# Patient Record
Sex: Female | Born: 1995 | Race: Black or African American | Hispanic: No | Marital: Single | Smoking: Never smoker
Health system: Southern US, Community
[De-identification: ages and names within clinical notes are randomized; demographics above are authoritative.]

## PROBLEM LIST (undated history)

## (undated) DIAGNOSIS — U071 COVID-19: Secondary | ICD-10-CM

## (undated) DIAGNOSIS — E119 Type 2 diabetes mellitus without complications: Secondary | ICD-10-CM

## (undated) HISTORY — DX: Type 2 diabetes mellitus without complications: E11.9

---

## 2019-03-23 ENCOUNTER — Other Ambulatory Visit: Payer: Self-pay

## 2019-03-23 DIAGNOSIS — Z20822 Contact with and (suspected) exposure to covid-19: Secondary | ICD-10-CM

## 2019-03-24 LAB — NOVEL CORONAVIRUS, NAA: SARS-CoV-2, NAA: NOT DETECTED

## 2020-02-01 ENCOUNTER — Encounter (HOSPITAL_COMMUNITY): Payer: Self-pay

## 2020-02-01 ENCOUNTER — Inpatient Hospital Stay (HOSPITAL_COMMUNITY)
Admission: EM | Admit: 2020-02-01 | Discharge: 2020-02-05 | DRG: 176 | Disposition: A | Discharge status: Home / Self Care | Payer: 59 | Attending: Internal Medicine | Admitting: Internal Medicine

## 2020-02-01 ENCOUNTER — Emergency Department (HOSPITAL_COMMUNITY): Payer: 59

## 2020-02-01 ENCOUNTER — Other Ambulatory Visit: Payer: Self-pay

## 2020-02-01 DIAGNOSIS — Z6841 Body Mass Index (BMI) 40.0 and over, adult: Secondary | ICD-10-CM

## 2020-02-01 DIAGNOSIS — Z8249 Family history of ischemic heart disease and other diseases of the circulatory system: Secondary | ICD-10-CM

## 2020-02-01 DIAGNOSIS — Z833 Family history of diabetes mellitus: Secondary | ICD-10-CM | POA: Diagnosis not present

## 2020-02-01 DIAGNOSIS — I2699 Other pulmonary embolism without acute cor pulmonale: Secondary | ICD-10-CM | POA: Diagnosis not present

## 2020-02-01 DIAGNOSIS — Z8616 Personal history of COVID-19: Secondary | ICD-10-CM

## 2020-02-01 DIAGNOSIS — U071 COVID-19: Secondary | ICD-10-CM | POA: Diagnosis present

## 2020-02-01 DIAGNOSIS — R042 Hemoptysis: Secondary | ICD-10-CM | POA: Diagnosis not present

## 2020-02-01 DIAGNOSIS — B948 Sequelae of other specified infectious and parasitic diseases: Secondary | ICD-10-CM

## 2020-02-01 DIAGNOSIS — R0602 Shortness of breath: Secondary | ICD-10-CM | POA: Diagnosis present

## 2020-02-01 DIAGNOSIS — N200 Calculus of kidney: Secondary | ICD-10-CM | POA: Diagnosis present

## 2020-02-01 HISTORY — DX: COVID-19: U07.1

## 2020-02-01 LAB — CBC WITH DIFFERENTIAL/PLATELET
Abs Immature Granulocytes: 0.03 10*3/uL (ref 0.00–0.07)
Basophils Absolute: 0 10*3/uL (ref 0.0–0.1)
Basophils Relative: 0 %
Eosinophils Absolute: 0.1 10*3/uL (ref 0.0–0.5)
Eosinophils Relative: 1 %
HCT: 38.9 % (ref 36.0–46.0)
Hemoglobin: 12.7 g/dL (ref 12.0–15.0)
Immature Granulocytes: 0 %
Lymphocytes Relative: 38 %
Lymphs Abs: 3.1 10*3/uL (ref 0.7–4.0)
MCH: 28.3 pg (ref 26.0–34.0)
MCHC: 32.6 g/dL (ref 30.0–36.0)
MCV: 86.8 fL (ref 80.0–100.0)
Monocytes Absolute: 1.3 10*3/uL — ABNORMAL HIGH (ref 0.1–1.0)
Monocytes Relative: 16 %
Neutro Abs: 3.7 10*3/uL (ref 1.7–7.7)
Neutrophils Relative %: 45 %
Platelets: 426 10*3/uL — ABNORMAL HIGH (ref 150–400)
RBC: 4.48 MIL/uL (ref 3.87–5.11)
RDW: 15.1 % (ref 11.5–15.5)
WBC: 8.2 10*3/uL (ref 4.0–10.5)
nRBC: 0 % (ref 0.0–0.2)

## 2020-02-01 LAB — URINALYSIS, ROUTINE W REFLEX MICROSCOPIC
Bacteria, UA: NONE SEEN
Bilirubin Urine: NEGATIVE
Glucose, UA: NEGATIVE mg/dL
Hgb urine dipstick: NEGATIVE
Ketones, ur: NEGATIVE mg/dL
Nitrite: NEGATIVE
Protein, ur: 100 mg/dL — AB
Specific Gravity, Urine: 1.026 (ref 1.005–1.030)
pH: 5 (ref 5.0–8.0)

## 2020-02-01 LAB — SARS CORONAVIRUS 2 BY RT PCR (HOSPITAL ORDER, PERFORMED IN ~~LOC~~ HOSPITAL LAB): SARS Coronavirus 2: POSITIVE — AB

## 2020-02-01 LAB — BASIC METABOLIC PANEL
Anion gap: 14 (ref 5–15)
BUN: 11 mg/dL (ref 6–20)
CO2: 21 mmol/L — ABNORMAL LOW (ref 22–32)
Calcium: 9.8 mg/dL (ref 8.9–10.3)
Chloride: 105 mmol/L (ref 98–111)
Creatinine, Ser: 0.93 mg/dL (ref 0.44–1.00)
GFR calc Af Amer: >60 mL/min (ref >60)
GFR calc non Af Amer: >60 mL/min (ref >60)
Glucose, Bld: 114 mg/dL — ABNORMAL HIGH (ref 70–99)
Potassium: 3.8 mmol/L (ref 3.5–5.1)
Sodium: 140 mmol/L (ref 135–145)

## 2020-02-01 LAB — I-STAT BETA HCG BLOOD, ED (MC, WL, AP ONLY): I-stat hCG, quantitative: <5 m[IU]/mL (ref <5)

## 2020-02-01 LAB — PROTIME-INR
INR: 1 (ref 0.8–1.2)
INR: 1 (ref 0.8–1.2)
Prothrombin Time: 12.9 seconds (ref 11.4–15.2)
Prothrombin Time: 12.8 seconds (ref 11.4–15.2)

## 2020-02-01 LAB — APTT: aPTT: 23 seconds — ABNORMAL LOW (ref 24–36)

## 2020-02-01 MED ORDER — HEPARIN (PORCINE) 25000 UT/250ML-% IV SOLN
1300.0000 [IU]/h | INTRAVENOUS | Status: DC
  Administered 2020-02-01: 1300 [IU]/h via INTRAVENOUS
  Filled 2020-02-01: qty 250

## 2020-02-01 MED ORDER — ONDANSETRON HCL 4 MG/2ML IJ SOLN
4.0000 mg | Freq: Four times a day (QID) | INTRAMUSCULAR | Status: DC | PRN
  Administered 2020-02-01 – 2020-02-03 (×2): 4 mg via INTRAVENOUS
  Filled 2020-02-01 (×2): qty 2

## 2020-02-01 MED ORDER — ALBUTEROL SULFATE HFA 108 (90 BASE) MCG/ACT IN AERS
1.0000 | INHALATION_SPRAY | Freq: Four times a day (QID) | RESPIRATORY_TRACT | Status: DC | PRN
  Administered 2020-02-01 – 2020-02-03 (×2): 2 via RESPIRATORY_TRACT
  Filled 2020-02-01: qty 6.7

## 2020-02-01 MED ORDER — SODIUM CHLORIDE 0.9% FLUSH
3.0000 mL | Freq: Two times a day (BID) | INTRAVENOUS | Status: DC
  Administered 2020-02-02 – 2020-02-05 (×7): 3 mL via INTRAVENOUS

## 2020-02-01 MED ORDER — ONDANSETRON HCL 4 MG PO TABS: 4.0000 mg | ORAL_TABLET | Freq: Four times a day (QID) | ORAL | Status: DC | PRN

## 2020-02-01 MED ORDER — GUAIFENESIN-DM 100-10 MG/5ML PO SYRP
10.0000 mL | ORAL_SOLUTION | ORAL | Status: DC | PRN
  Administered 2020-02-03: 10 mL via ORAL
  Filled 2020-02-01: qty 10

## 2020-02-01 MED ORDER — MORPHINE SULFATE (PF) 2 MG/ML IV SOLN
2.0000 mg | INTRAVENOUS | Status: DC | PRN
  Administered 2020-02-01 – 2020-02-02 (×2): 2 mg via INTRAVENOUS
  Filled 2020-02-01 (×2): qty 1

## 2020-02-01 MED ORDER — ACETAMINOPHEN 325 MG PO TABS
650.0000 mg | ORAL_TABLET | Freq: Four times a day (QID) | ORAL | Status: DC | PRN
  Administered 2020-02-03 – 2020-02-05 (×2): 650 mg via ORAL
  Filled 2020-02-01 (×2): qty 2

## 2020-02-01 MED ORDER — HYDROCOD POLST-CPM POLST ER 10-8 MG/5ML PO SUER
5.0000 mL | Freq: Two times a day (BID) | ORAL | Status: DC | PRN
  Administered 2020-02-03: 5 mL via ORAL
  Filled 2020-02-01: qty 5

## 2020-02-01 MED ORDER — ACETAMINOPHEN 325 MG PO TABS
650.0000 mg | ORAL_TABLET | Freq: Once | ORAL | Status: AC
  Administered 2020-02-01: 650 mg via ORAL
  Filled 2020-02-01: qty 2

## 2020-02-01 MED ORDER — SODIUM CHLORIDE (PF) 0.9 % IJ SOLN
INTRAMUSCULAR | Status: AC
  Administered 2020-02-01: 3 mL via INTRAVENOUS
  Filled 2020-02-01: qty 50

## 2020-02-01 MED ORDER — HEPARIN BOLUS VIA INFUSION
4000.0000 [IU] | Freq: Once | INTRAVENOUS | Status: AC
  Administered 2020-02-01: 4000 [IU] via INTRAVENOUS
  Filled 2020-02-01: qty 4000

## 2020-02-01 MED ORDER — IOHEXOL 350 MG/ML SOLN
100.0000 mL | Freq: Once | INTRAVENOUS | Status: AC | PRN
  Administered 2020-02-01: 100 mL via INTRAVENOUS

## 2020-02-01 MED ORDER — ONDANSETRON HCL 4 MG/2ML IJ SOLN
4.0000 mg | Freq: Once | INTRAMUSCULAR | Status: AC
  Administered 2020-02-01: 4 mg via INTRAVENOUS
  Filled 2020-02-01: qty 2

## 2020-02-01 MED ORDER — HYDROMORPHONE HCL 1 MG/ML IJ SOLN
1.0000 mg | Freq: Once | INTRAMUSCULAR | Status: AC
  Administered 2020-02-01: 1 mg via INTRAVENOUS
  Filled 2020-02-01: qty 1

## 2020-02-01 MED ORDER — HYDROCODONE-ACETAMINOPHEN 5-325 MG PO TABS
1.0000 | ORAL_TABLET | Freq: Once | ORAL | Status: AC
  Administered 2020-02-01: 1 via ORAL
  Filled 2020-02-01: qty 1

## 2020-02-01 MED ORDER — HYDROCODONE-ACETAMINOPHEN 5-325 MG PO TABS
1.0000 | ORAL_TABLET | Freq: Four times a day (QID) | ORAL | Status: DC | PRN
  Administered 2020-02-02: 1 via ORAL
  Filled 2020-02-01: qty 1

## 2020-02-01 NOTE — ED Triage Notes (Signed)
Patient c/o right uppr abdominal pain, right upper back pain and SOB.  Patient states she tested Covid + more than 2 weeks ago. Patient states her quarantine was over 3 days ago. Patient reports that she began having the abdominal, upper back pain and SOB yesterday.

## 2020-02-01 NOTE — Progress Notes (Signed)
ANTICOAGULATION CONSULT NOTE - Initial Consult  Pharmacy Consult for heparin Indication: pulmonary embolus  No Known Allergies  Patient Measurements: Height: 5\' 3"  (160 cm) Weight: 104.4 kg (230 lb 3.2 oz) IBW/kg (Calculated) : 52.4 Heparin Dosing Weight: 77.2kg  Vital Signs: Temp: 98.6 F (37 C) (09/04 1918) Temp Source: Oral (09/04 1918) BP: 124/78 (09/04 1918) Pulse Rate: 106 (09/04 1918)  Labs: Recent Labs    02/01/20 1740  HGB 12.7  HCT 38.9  PLT 426*  CREATININE 0.93    Estimated Creatinine Clearance: 110.6 mL/min (by C-G formula based on SCr of 0.93 mg/dL).   Medical History: Past Medical History:  Diagnosis Date  . COVID-19      Assessment: 24 yo female with Patient c/o right uppr abdominal pain, right upper back pain and SOB. Patient states she tested Covid + more than 2 weeks ago.  Pharmacy consulted to dose heparin drip for PE.  No prior AC noted.  02/01/2020  CBC WNL Scr WNL APTT and pt/INR ordered stat   Goal of Therapy:  Heparin level 0.3-0.7 units/ml Monitor platelets by anticoagulation protocol:   Plan:  Heparin bolus 4000 units x 1 then start heparin drip at 1300 units/hr Check heparin level in 6 hours Daily CBC  04/02/2020 RPh 02/01/2020, 7:56 PM

## 2020-02-01 NOTE — ED Provider Notes (Signed)
[facility]-EMERGENCY DEPT Provider Note   CSN: [id number] Arrival date & time: 02/01/20  1407     History Chief Complaint  Patient presents with  . Shortness of Breath  . Back Pain  . Abdominal Pain    Tracey Montgomery is a 24 y.o. female.  HPI    24 y/o comes in with cc of chest pain and back pain. She is having R sided chest pain and back pain, no new cough. Chest pain is worse with cough and she has associated dib. She is 2+ weeks post covid.  Pt has no hx of PE, DVT and denies any exogenous hormone (testosterone / estrogen) use, long distance travels or surgery in the past 6 weeks, active cancer, recent immobilization.  Past Medical History:  Diagnosis Date  . COVID-19     Patient Active Problem List   Diagnosis Date Noted  . 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 COVID-19 virus infection     History reviewed. No pertinent surgical history.   OB History   No obstetric history on file.     Family History  Problem Relation Age of Onset  . Diabetes Mother   . Hypertension Father     Social History   Tobacco Use  . Smoking status: Never Smoker  . Smokeless tobacco: Never Used  Vaping Use  . Vaping Use: Never used  Substance Use Topics  . Alcohol use: Yes  . Drug use: Never    Home Medications Prior to Admission medications   Not on File    Allergies    Patient has no known allergies.  Review of Systems   Review of Systems  Constitutional: Positive for activity change.  Respiratory: Positive for shortness of breath.   Cardiovascular: Positive for chest pain.  Musculoskeletal: Positive for back pain.  Neurological: Negative for dizziness and light-headedness.  All other systems reviewed and are negative.   Physical Exam Updated Vital Signs BP (!) 140/91 (BP Location: Left Arm)   Pulse (!) 121   Temp 98.6 F (37 C) (Oral)   Resp (!) 24   Ht 5\' 3"  (1.6 m)   Wt 104.4 kg   LMP 01/01/2020   SpO2 98%    BMI 40.78 kg/m   Physical Exam Vitals and nursing note reviewed.  Constitutional:      Appearance: She is well-developed.  HENT:     Head: Normocephalic and atraumatic.  Cardiovascular:     Rate and Rhythm: Normal rate.  Pulmonary:     Effort: Pulmonary effort is normal.     Breath sounds: No decreased breath sounds or wheezing.  Abdominal:     General: Bowel sounds are normal.  Musculoskeletal:     Cervical back: Normal range of motion and neck supple.  Skin:    General: Skin is warm and dry.  Neurological:     Mental Status: She is alert and oriented to person, place, and time.     ED Results / Procedures / Treatments   Labs (all labs ordered are listed, but only abnormal results are displayed) Labs Reviewed  SARS CORONAVIRUS 2 BY RT PCR (HOSPITAL ORDER, PERFORMED IN [facility]) - Abnormal; Notable for the following components:      Result Value   SARS Coronavirus 2 POSITIVE (*)    All other components within normal limits  BASIC METABOLIC PANEL - Abnormal; Notable for the following components:   CO2 21 (*)    Glucose, Bld 114 (*)  All other components within normal limits  CBC WITH DIFFERENTIAL/PLATELET - Abnormal; Notable for the following components:   Platelets 426 (*)    Monocytes Absolute 1.3 (*)    All other components within normal limits  PROTIME-INR  URINALYSIS, ROUTINE W REFLEX MICROSCOPIC  APTT  PROTIME-INR  CBC  HEPARIN LEVEL (UNFRACTIONATED)  HIV ANTIBODY (ROUTINE TESTING W REFLEX)  BASIC METABOLIC PANEL  I-STAT BETA HCG BLOOD, ED ([facility], [facility], [facility] ONLY)    EKG None  Radiology DG Chest 2 View  Result Date: 02/01/2020 CLINICAL DATA:  Right upper quadrant abdominal pain. Right upper back pain. Shortness of breath since yesterday. COVID-19 positive test more than 2 weeks ago. EXAM: CHEST - 2 VIEW COMPARISON:  None. FINDINGS: Normal sized heart. Clear lungs. Mild peribronchial thickening. Mild scoliosis. IMPRESSION: Mild bronchitic  changes. Electronically Signed   By: Beckie Salts M.D.   On: 02/01/2020 15:39   CT Angio Chest PE W and/or Wo Contrast  Result Date: 02/01/2020 CLINICAL DATA:  Right upper abdominal pain, right upper back pain and shortness of breath. COVID-19 positive test more than 2 weeks ago. EXAM: CT ANGIOGRAPHY CHEST WITH CONTRAST TECHNIQUE: Multidetector CT imaging of the chest was performed using the standard protocol during bolus administration of intravenous contrast. Multiplanar CT image reconstructions and MIPs were obtained to evaluate the vascular anatomy. CONTRAST:  OMNIPAQUE IOHEXOL 350 MG/ML SOLN COMPARISON:  Chest radiographs obtained earlier today. FINDINGS: Cardiovascular: Large pulmonary arterial filling defect beginning in the distal right main pulmonary artery and extending into the right lower lobe pulmonary arteries. The right ventricular to left ventricular ratio is 0.58. Normal sized heart and central pulmonary arteries. Mediastinum/Nodes: No enlarged mediastinal, hilar, or axillary lymph nodes. Thyroid gland, trachea, and esophagus demonstrate no significant findings. Lungs/Pleura: Wedge-shaped area of airspace opacity in the posterior aspect of the right lower lobe. Mild left lower lobe atelectasis. Areas of mild peripheral patchy interstitial opacity in both lungs. No pleural fluid. Upper Abdomen: 4 mm mid right renal calculus. Musculoskeletal: Normal appearing bones. Review of the MIP images confirms the above findings. IMPRESSION: 1. Large right-sided pulmonary emboli, as described above, without right heart strain. 2. Wedge-shaped area of airspace opacity in the posterior aspect of the right lower lobe, compatible with a pulmonary infarct. 3. Mild left lower lobe atelectasis. 4. Areas of mild peripheral patchy interstitial opacity in both lungs, compatible with changes due to COVID-19 pneumonitis. 5. 4 mm nonobstructing right renal calculus. Critical Value/emergent results were called by  telephone at the time of interpretation on 02/01/2020 at 7:36 pm to provider [facility] , who verbally acknowledged these results. Electronically Signed   By: Beckie Salts M.D.   On: 02/01/2020 19:37    Procedures .Critical Care Performed by: Derwood Kaplan, MD Authorized by: Derwood Kaplan, MD   Critical care provider statement:    Critical care time (minutes):  48   Critical care was time spent personally by me on the following activities:  Discussions with consultants, evaluation of patient's response to treatment, examination of patient, ordering and performing treatments and interventions, ordering and review of laboratory studies, ordering and review of radiographic studies, pulse oximetry, re-evaluation of patient's condition, obtaining history from patient or surrogate and review of old charts   (including critical care time)  Medications Ordered in ED Medications  heparin ADULT infusion 100 units/mL (25000 units/282mL sodium chloride 0.45%) (1,300 Units/hr Intravenous New Bag/Given 02/01/20 2042)  sodium chloride flush (NS) 0.9 % injection 3 mL (3 mLs Intravenous Given  02/01/20 2123)  albuterol (VENTOLIN HFA) 108 (90 Base) MCG/ACT inhaler 1-2 puff (2 puffs Inhalation Given 02/01/20 2122)  guaiFENesin-dextromethorphan (ROBITUSSIN DM) 100-10 MG/5ML syrup 10 mL (has no administration in time range)  chlorpheniramine-HYDROcodone (TUSSIONEX) 10-8 MG/5ML suspension 5 mL (has no administration in time range)  acetaminophen (TYLENOL) tablet 650 mg (has no administration in time range)  ondansetron (ZOFRAN) tablet 4 mg ( Oral See Alternative 02/01/20 2122)    Or  ondansetron (ZOFRAN) injection 4 mg (4 mg Intravenous Given 02/01/20 2122)  HYDROcodone-acetaminophen (NORCO/VICODIN) 5-325 MG per tablet 1 tablet (has no administration in time range)  morphine 2 MG/ML injection 2 mg (2 mg Intravenous Given 02/01/20 2124)  HYDROmorphone (DILAUDID) injection 1 mg (1 mg Intravenous Given 02/01/20 1823)   ondansetron (ZOFRAN) injection 4 mg (4 mg Intravenous Given 02/01/20 1823)  iohexol (OMNIPAQUE) 350 MG/ML injection 100 mL (100 mLs Intravenous Contrast Given 02/01/20 1857)  acetaminophen (TYLENOL) tablet 650 mg (650 mg Oral Given 02/01/20 2023)  HYDROcodone-acetaminophen (NORCO/VICODIN) 5-325 MG per tablet 1 tablet (1 tablet Oral Given 02/01/20 2023)  heparin bolus via infusion 4,000 Units (4,000 Units Intravenous Bolus from Bag 02/01/20 2043)    ED Course  I have reviewed the triage vital signs and the nursing notes.  Pertinent labs & imaging results that were available during my care of the patient were reviewed by me and considered in my medical decision making (see chart for details).  Clinical Course as of Feb 01 2131  Sat Feb 01, 2020  2131 Results reviewed with the patient. Infarct the cause for severe pain. No syncope, HD stable and no hypoxia. No R sided strain. Heparin ordered.  CT Angio Chest PE W and/or Wo Contrast [AN]    Clinical Course User Index [AN] Derwood Kaplan, MD   MDM Rules/Calculators/A&P                          Belmira Daley was evaluated in Emergency Department on 02/01/2020 for the symptoms described in the history of present illness. She was evaluated in the context of the global COVID-19 pandemic, which necessitated consideration that the patient might be at risk for infection with the SARS-CoV-2 virus that causes COVID-19. Institutional protocols and algorithms that pertain to the evaluation of patients at risk for COVID-19 are in a state of rapid change based on information released by regulatory bodies including the CDC and federal and state organizations. These policies and algorithms were followed during the patient's care in the ED.   Pt comes in with cc of CP and back pain, pleuritic with DIB. + covid hx. Concerns for PE - CT ordered. CXR reviewed. No PTX or PNA.     Final Clinical Impression(s) / ED Diagnoses Final diagnoses:  Acute pulmonary  embolism, unspecified pulmonary embolism type, unspecified whether acute cor pulmonale present [facility])  Pulmonary infarct [facility])  History of COVID-19    Rx / DC Orders ED Discharge Orders    None       Derwood Kaplan, MD 02/01/20 2132

## 2020-02-01 NOTE — ED Notes (Signed)
Patient screaming and making noises stating hurts when she take a deep breath. Patient appears to be quite anxious and tearful. Patient is able to talk with no issue. O2 sat 93%. Patient placed on O2

## 2020-02-01 NOTE — H&P (Signed)
History and Physical    Alvina Strother JJK:[id number] DOB: [birth year] DOA: 02/01/2020  PCP: System, Pcp Not In  Patient coming from: Home  I have personally briefly reviewed patient's old medical records in [facility]  Chief Complaint: Shortness of breath  HPI: Tracey Montgomery is a 24 y.o. female with medical history significant for obesity who presents to the ED for evaluation of shortness of breath.  Patient states she was diagnosed with COVID-19 approximately 2 weeks ago.  At that time she had typical symptoms with shortness of breath, nausea, vomiting, diarrhea, rhinorrhea, sinus and chest congestion.  She did not have any fevers.  She did not require hospitalization and symptoms were managed supportively at home.  She says her symptoms resolved after 5 days with the exception of some continued shortness of breath.    Dyspnea has been progressive and yesterday had worsening shortness of breath with increased work of breathing, right upper back pain, and pleuritic chest pain.  She denies any associated cough.  She has not had any swelling or pain in her lower extremities.  She denies any obvious bleeding.  She came to the ED for further evaluation.  She denies any tobacco use.  She denies any known history of blood clots in her blood relatives.  She is not using any estrogen-containing products.  She has not had any recent surgeries.  She says she has not taken any medications regularly.  ED Course:  Initial vitals showed BP 130/79, pulse 89, RR 16, temp 99.2 Fahrenheit, SPO2 98% on room air.  Labs show WBC 8.2, hemoglobin 12.7, platelets 426,000, sodium 140, potassium 3.8, bicarb 21, BUN 11, creatinine 0.93, serum glucose 114. I-STAT beta-hCG <5.0.  SARS-CoV-2 PCR is positive.  2 view chest x-ray showed mild bronchitic changes.  CTA chest PE study showed a large right-sided pulmonary emboli with RLL pulmonary infarct. No CT evidence of right heart strain seen. Left lower lobe  atelectasis and mild peripheral patchy interstitial opacities seen. 4 mm nonobstructing right renal calculus also noted.  Patient was given IV Dilaudid and started on IV heparin anticoagulation per pharmacy. The hospitalist service was consulted to admit for further evaluation and management.  Review of Systems: All systems reviewed and are negative except as documented in history of present illness above.   Past Medical History:  Diagnosis Date  . COVID-19     History reviewed. No pertinent surgical history.  Social History:  reports that she has never smoked. She has never used smokeless tobacco. She reports current alcohol use. She reports that she does not use drugs.  No Known Allergies  Family History  Problem Relation Age of Onset  . Diabetes Mother   . Hypertension Father      Prior to Admission medications   Not on File    Physical Exam: Vitals:   02/01/20 1849 02/01/20 1918 02/01/20 2021 02/01/20 2046  BP: (!) 154/84 124/78 138/88 125/83  Pulse: (!) 107 (!) 106 (!) 117 (!) 111  Resp: 20 20 18 16   Temp:  98.6 F (37 C)    TempSrc:  Oral    SpO2: 98% 94% 95% 97%  Weight:      Height:       Constitutional: Sitting up in bed, NAD, calm, comfortable Eyes: PERRL, lids and conjunctivae normal ENMT: Mucous membranes are moist. Posterior pharynx clear of any exudate or lesions.Normal dentition.  Neck: normal, supple, no masses. Respiratory: Bibasilar inspiratory crackles.  Increased respiratory effort. No accessory muscle use.  Cardiovascular: Tachycardic, no murmurs / rubs / gallops. No extremity edema. 2+ pedal pulses. Abdomen: no tenderness, no masses palpated. No hepatosplenomegaly. Bowel sounds positive.  Musculoskeletal: no clubbing / cyanosis. No joint deformity upper and lower extremities. Good ROM, no contractures. Normal muscle tone.  Skin: no rashes, lesions, ulcers. No induration Neurologic: CN 2-12 grossly intact. Sensation intact, Strength 5/5 in all  4.  Psychiatric: Normal judgment and insight. Alert and oriented x 3. Normal mood.   Labs on Admission: I have personally reviewed following labs and imaging studies  CBC: Recent Labs  Lab 02/01/20 1740  WBC 8.2  NEUTROABS 3.7  HGB 12.7  HCT 38.9  MCV 86.8  PLT 426*   Basic Metabolic Panel: Recent Labs  Lab 02/01/20 1740  NA 140  K 3.8  CL 105  CO2 21*  GLUCOSE 114*  BUN 11  CREATININE 0.93  CALCIUM 9.8   GFR: Estimated Creatinine Clearance: 110.6 mL/min (by C-G formula based on SCr of 0.93 mg/dL). Liver Function Tests: No results for input(s): AST, ALT, ALKPHOS, BILITOT, PROT, ALBUMIN in the last 168 hours. No results for input(s): LIPASE, AMYLASE in the last 168 hours. No results for input(s): AMMONIA in the last 168 hours. Coagulation Profile: Recent Labs  Lab 02/01/20 1740  INR 1.0   Cardiac Enzymes: No results for input(s): CKTOTAL, CKMB, CKMBINDEX, TROPONINI in the last 168 hours. BNP (last 3 results) No results for input(s): PROBNP in the last 8760 hours. HbA1C: No results for input(s): HGBA1C in the last 72 hours. CBG: No results for input(s): GLUCAP in the last 168 hours. Lipid Profile: No results for input(s): CHOL, HDL, LDLCALC, TRIG, CHOLHDL, LDLDIRECT in the last 72 hours. Thyroid Function Tests: No results for input(s): TSH, T4TOTAL, FREET4, T3FREE, THYROIDAB in the last 72 hours. Anemia Panel: No results for input(s): VITAMINB12, FOLATE, FERRITIN, TIBC, IRON, RETICCTPCT in the last 72 hours. Urine analysis: No results found for: COLORURINE, APPEARANCEUR, LABSPEC, PHURINE, GLUCOSEU, HGBUR, BILIRUBINUR, KETONESUR, PROTEINUR, UROBILINOGEN, NITRITE, LEUKOCYTESUR  Radiological Exams on Admission: DG Chest 2 View  Result Date: 02/01/2020 CLINICAL DATA:  Right upper quadrant abdominal pain. Right upper back pain. Shortness of breath since yesterday. COVID-19 positive test more than 2 weeks ago. EXAM: CHEST - 2 VIEW COMPARISON:  None. FINDINGS:  Normal sized heart. Clear lungs. Mild peribronchial thickening. Mild scoliosis. IMPRESSION: Mild bronchitic changes. Electronically Signed   By: Beckie Salts M.D.   On: 02/01/2020 15:39   CT Angio Chest PE W and/or Wo Contrast  Result Date: 02/01/2020 CLINICAL DATA:  Right upper abdominal pain, right upper back pain and shortness of breath. COVID-19 positive test more than 2 weeks ago. EXAM: CT ANGIOGRAPHY CHEST WITH CONTRAST TECHNIQUE: Multidetector CT imaging of the chest was performed using the standard protocol during bolus administration of intravenous contrast. Multiplanar CT image reconstructions and MIPs were obtained to evaluate the vascular anatomy. CONTRAST:  OMNIPAQUE IOHEXOL 350 MG/ML SOLN COMPARISON:  Chest radiographs obtained earlier today. FINDINGS: Cardiovascular: Large pulmonary arterial filling defect beginning in the distal right main pulmonary artery and extending into the right lower lobe pulmonary arteries. The right ventricular to left ventricular ratio is 0.58. Normal sized heart and central pulmonary arteries. Mediastinum/Nodes: No enlarged mediastinal, hilar, or axillary lymph nodes. Thyroid gland, trachea, and esophagus demonstrate no significant findings. Lungs/Pleura: Wedge-shaped area of airspace opacity in the posterior aspect of the right lower lobe. Mild left lower lobe atelectasis. Areas of mild peripheral patchy interstitial opacity in both lungs. No pleural fluid.  Upper Abdomen: 4 mm mid right renal calculus. Musculoskeletal: Normal appearing bones. Review of the MIP images confirms the above findings. IMPRESSION: 1. Large right-sided pulmonary emboli, as described above, without right heart strain. 2. Wedge-shaped area of airspace opacity in the posterior aspect of the right lower lobe, compatible with a pulmonary infarct. 3. Mild left lower lobe atelectasis. 4. Areas of mild peripheral patchy interstitial opacity in both lungs, compatible with changes due to COVID-19  pneumonitis. 5. 4 mm nonobstructing right renal calculus. Critical Value/emergent results were called by telephone at the time of interpretation on 02/01/2020 at 7:36 pm to provider [facility] , who verbally acknowledged these results. Electronically Signed   By: Beckie Salts M.D.   On: 02/01/2020 19:37    EKG: Ordered and pending.  Assessment/Plan Principal Problem:   Pulmonary embolus with infarction [facility]) Active Problems:   COVID-19 virus infection  Loza Prell is a 24 y.o. female with medical history significant for obesity who is admitted with an acute large right-sided pulmonary embolus with RLL pulmonary infarct in the setting of recent COVID-19 infection.  Acute large right-sided pulmonary embolus with right lower lobe pulmonary infarct: Seen on CT imaging without evidence of right heart strain.  Currently hemodynamically stable and saturating well on room air.  Likely provoked from recent COVID-19 infection.  She has been started on heparin anticoagulation. -Continue IV heparin per pharmacy -Supplemental oxygen as needed -Obtain echocardiogram -Can likely transition to DOAC at time of discharge  COVID-19 viral infection: Reports testing for Covid positive approximately 2 weeks prior to this admission, records not available.  SARS-CoV-2 PCR here is +9/4.  She will need to remain on COVID-19 precautions.  Currently do not see indication to start on remdesivir/steroid therapy but will have low threshold to begin pending clinical course.  DVT prophylaxis: Heparin anticoagulation Code Status: Full code, confirmed with patient Family Communication: Discussed with patient, she has discussed with family Disposition Plan: From home and likely discharge to home after echocardiogram completed and when able to transition to oral anticoagulation, likely needs 48 hours of IV heparin prior to DC. Consults called: None Admission status:  Status is: Inpatient  Remains inpatient  appropriate because:Ongoing diagnostic testing needed not appropriate for outpatient work up, IV treatments appropriate due to intensity of illness or inability to take PO and Inpatient level of care appropriate due to severity of illness   Dispo: The patient is from: Home              Anticipated d/c is to: Home              Anticipated d/c date is: 2 days              Patient currently is not medically stable to d/c.    Darreld Mclean MD [facility]  If 7PM-7AM, please contact night-coverage [email or web address]  02/01/2020, 9:01 PM

## 2020-02-02 ENCOUNTER — Inpatient Hospital Stay (HOSPITAL_COMMUNITY): Payer: 59

## 2020-02-02 DIAGNOSIS — I2699 Other pulmonary embolism without acute cor pulmonale: Secondary | ICD-10-CM

## 2020-02-02 DIAGNOSIS — R0602 Shortness of breath: Secondary | ICD-10-CM

## 2020-02-02 LAB — HEPARIN LEVEL (UNFRACTIONATED)
Heparin Unfractionated: 0.27 IU/mL — ABNORMAL LOW (ref 0.30–0.70)
Heparin Unfractionated: 1.24 IU/mL — ABNORMAL HIGH (ref 0.30–0.70)
Heparin Unfractionated: 0.36 IU/mL (ref 0.30–0.70)
Heparin Unfractionated: 0.4 IU/mL (ref 0.30–0.70)

## 2020-02-02 LAB — ECHOCARDIOGRAM LIMITED
Area-P 1/2: 3.53 cm2
Height: 63 in
S' Lateral: 3.1 cm
Single Plane A4C EF: 54.2 %
Weight: 3683.2 oz

## 2020-02-02 LAB — CBC
HCT: 35.4 % — ABNORMAL LOW (ref 36.0–46.0)
Hemoglobin: 11.2 g/dL — ABNORMAL LOW (ref 12.0–15.0)
MCH: 27.7 pg (ref 26.0–34.0)
MCHC: 31.6 g/dL (ref 30.0–36.0)
MCV: 87.6 fL (ref 80.0–100.0)
Platelets: 354 10*3/uL (ref 150–400)
RBC: 4.04 MIL/uL (ref 3.87–5.11)
RDW: 15.1 % (ref 11.5–15.5)
WBC: 10.4 10*3/uL (ref 4.0–10.5)
nRBC: 0 % (ref 0.0–0.2)

## 2020-02-02 LAB — BASIC METABOLIC PANEL
Anion gap: 9 (ref 5–15)
BUN: 7 mg/dL (ref 6–20)
CO2: 24 mmol/L (ref 22–32)
Calcium: 9.4 mg/dL (ref 8.9–10.3)
Chloride: 105 mmol/L (ref 98–111)
Creatinine, Ser: 0.78 mg/dL (ref 0.44–1.00)
GFR calc Af Amer: >60 mL/min (ref >60)
GFR calc non Af Amer: >60 mL/min (ref >60)
Glucose, Bld: 114 mg/dL — ABNORMAL HIGH (ref 70–99)
Potassium: 4.4 mmol/L (ref 3.5–5.1)
Sodium: 138 mmol/L (ref 135–145)

## 2020-02-02 LAB — HIV ANTIBODY (ROUTINE TESTING W REFLEX): HIV Screen 4th Generation wRfx: NONREACTIVE

## 2020-02-02 MED ORDER — MORPHINE SULFATE (PF) 4 MG/ML IV SOLN
4.0000 mg | INTRAVENOUS | Status: DC | PRN
  Administered 2020-02-02 – 2020-02-03 (×6): 4 mg via INTRAVENOUS
  Filled 2020-02-02 (×6): qty 1

## 2020-02-02 MED ORDER — HYDROCODONE-ACETAMINOPHEN 5-325 MG PO TABS
2.0000 | ORAL_TABLET | Freq: Four times a day (QID) | ORAL | Status: DC | PRN
  Administered 2020-02-02 – 2020-02-04 (×4): 2 via ORAL
  Filled 2020-02-02 (×4): qty 2

## 2020-02-02 MED ORDER — PERFLUTREN LIPID MICROSPHERE
1.0000 mL | INTRAVENOUS | Status: AC | PRN
  Administered 2020-02-02: 2 mL via INTRAVENOUS
  Filled 2020-02-02: qty 10

## 2020-02-02 MED ORDER — HYDROMORPHONE HCL 1 MG/ML IJ SOLN
1.0000 mg | Freq: Once | INTRAMUSCULAR | Status: AC
  Administered 2020-02-02: 1 mg via INTRAVENOUS
  Filled 2020-02-02: qty 1

## 2020-02-02 MED ORDER — HEPARIN (PORCINE) 25000 UT/250ML-% IV SOLN
1650.0000 [IU]/h | INTRAVENOUS | Status: AC
  Administered 2020-02-02 (×2): 1450 [IU]/h via INTRAVENOUS
  Filled 2020-02-02 (×2): qty 250

## 2020-02-02 NOTE — Progress Notes (Signed)
  Echocardiogram 2D Echocardiogram has been performed.  Tracey Montgomery 02/02/2020, 10:06 AM

## 2020-02-02 NOTE — Progress Notes (Signed)
ANTICOAGULATION CONSULT NOTE   Pharmacy Consult for heparin Indication: pulmonary embolus  No Known Allergies  Patient Measurements: Height: 5\' 3"  (160 cm) Weight: 104.4 kg (230 lb 3.2 oz) IBW/kg (Calculated) : 52.4 Heparin Dosing Weight: 77.2kg  Vital Signs: Temp: 99.4 F (37.4 C) (09/05 0256) Temp Source: Oral (09/05 0256) BP: 129/87 (09/05 0256) Pulse Rate: 95 (09/05 0256)  Labs: Recent Labs    02/01/20 1740 02/01/20 2026 02/02/20 0305 02/02/20 0310  HGB 12.7  --   --  11.2*  HCT 38.9  --   --  35.4*  PLT 426*  --   --  354  APTT  --  23*  --   --   LABPROT 12.9 12.8  --   --   INR 1.0 1.0  --   --   HEPARINUNFRC  --   --  0.27*  --   CREATININE 0.93  --   --  0.78    Estimated Creatinine Clearance: 128.5 mL/min (by C-G formula based on SCr of 0.78 mg/dL).   Medical History: Past Medical History:  Diagnosis Date  . COVID-19      Assessment: 24 yo female with Patient c/o right uppr abdominal pain, right upper back pain and SOB. Patient states she tested Covid + more than 2 weeks ago.  Pharmacy consulted to dose heparin drip for PE.  No prior AC noted.  02/02/2020  Heparin level = 0.27 (subtherapeutic) with heparin infusing @ 1300 units/hr H/H = 11.2/35.4; PLTC = 354 No complications of therapy noted  Goal of Therapy:  Heparin level 0.3-0.7 units/ml Monitor platelets by anticoagulation protocol:   Plan:  Increase heparin drip to 1450 units/hr Check heparin level in 6 hours Daily CBC  08-04-1999, PharmD 02/02/2020, 4:39 AM

## 2020-02-02 NOTE — Progress Notes (Signed)
ANTICOAGULATION CONSULT NOTE - Follow Up Consult  Pharmacy Consult for heparin Indication: acute bilateral pulmonary embolus  No Known Allergies  Patient Measurements: Height: 5\' 3"  (160 cm) Weight: 104.4 kg (230 lb 3.2 oz) IBW/kg (Calculated) : 52.4 Heparin Dosing Weight: 77 kg  Vital Signs: Temp: 99.4 F (37.4 C) (09/05 0256) Temp Source: Oral (09/05 0256) BP: 125/74 (09/05 0615) Pulse Rate: 108 (09/05 0615)  Labs: Recent Labs    02/01/20 1740 02/01/20 2026 02/02/20 0305 02/02/20 0310  HGB 12.7  --   --  11.2*  HCT 38.9  --   --  35.4*  PLT 426*  --   --  354  APTT  --  23*  --   --   LABPROT 12.9 12.8  --   --   INR 1.0 1.0  --   --   HEPARINUNFRC  --   --  0.27*  --   CREATININE 0.93  --   --  0.78    Estimated Creatinine Clearance: 128.5 mL/min (by C-G formula based on SCr of 0.78 mg/dL).  Assessment: Patient's a 24 y.o F who tested COVID+ two weeks ago, presented to the ED on 9/4 with c/o SOB, abdominal and back pain.  Chest CTA on 9/4 showed bilateral PE with no right heart strain.  She's currently on heparin drip for acute PE.  Today, 02/02/2020: - heparin level obtained at 11a was supra-therapeutic at 1.24, but blood sample collected on with same arm as heparin infusion.  Repeat heparin level collected at noon came back therapeutic at 0.36 - hgb 11.2, plts 354 - no bleeding documented  Goal of Therapy:  Heparin level 0.3-0.7 units/ml Monitor platelets by anticoagulation protocol: Yes   Plan:  - continue heparin drip at 1450 units/hr - recheck another level at 6pm to ensure level is still therapeutic before changing to daily monitoring - monitor for s/sx bleeding  04/03/2020 P 02/02/2020,11:06 AM

## 2020-02-02 NOTE — Progress Notes (Signed)
ANTICOAGULATION CONSULT NOTE - Follow Up Consult  Pharmacy Consult for heparin Indication: acute bilateral pulmonary embolus  No Known Allergies  Patient Measurements: Height: 5\' 3"  (160 cm) Weight: 104.4 kg (230 lb 3.2 oz) IBW/kg (Calculated) : 52.4 Heparin Dosing Weight: 77 kg  Vital Signs: Temp: 99.7 F (37.6 C) (09/05 1802) Temp Source: Oral (09/05 1802) BP: 129/89 (09/05 1900) Pulse Rate: 103 (09/05 1900)  Labs: Recent Labs    02/01/20 1740 02/01/20 2026 02/02/20 0305 02/02/20 0310 02/02/20 1040 02/02/20 1202 02/02/20 1848  HGB 12.7  --   --  11.2*  --   --   --   HCT 38.9  --   --  35.4*  --   --   --   PLT 426*  --   --  354  --   --   --   APTT  --  23*  --   --   --   --   --   LABPROT 12.9 12.8  --   --   --   --   --   INR 1.0 1.0  --   --   --   --   --   HEPARINUNFRC  --   --    < >  --  1.24* 0.36 0.40  CREATININE 0.93  --   --  0.78  --   --   --    < > = values in this interval not displayed.    Estimated Creatinine Clearance: 128.5 mL/min (by C-G formula based on SCr of 0.78 mg/dL).  Assessment: Patient's a 24 y.o F who tested COVID+ two weeks ago, presented to the ED on 9/4 with c/o SOB, abdominal and back pain.  Chest CTA on 9/4 showed bilateral PE with no right heart strain.  She's currently on heparin drip for acute PE.  Today, 02/02/2020: - heparin level obtained at 11a was supra-therapeutic at 1.24, but blood sample collected on with same arm as heparin infusion.  Repeat heparin level collected at noon came back therapeutic at 0.36 - hgb 11.2, plts 354 - no bleeding documented - 1848 confirmatory heparin level=0.4  Goal of Therapy:  Heparin level 0.3-0.7 units/ml Monitor platelets by anticoagulation protocol: Yes   Plan:  - continue heparin drip at 1450 units/hr - daily heparin level and CBC - monitor for s/sx bleeding  04/03/2020 RPh 02/02/2020, 7:34 PM

## 2020-02-02 NOTE — Progress Notes (Signed)
PROGRESS NOTE    Tracey Montgomery  MWN:[id number] DOB: 12-05-1995 DOA: 02/01/2020 PCP: System, Pcp Not In    Brief Narrative:  24 year old female with no medical issues, diagnosed with COVID-19 about 2 weeks ago treated symptomatically at home with some improvement however since last 5 days continues to have shortness of breath which is matter fact worsening with severe right-sided chest pain and cough.  No fever. In the emergency room, hemodynamically stable.  Initially on room air.  Electrolytes are normal.  COVID-19 positive.  CTA showed large right-sided pulmonary emboli, right lower lobe pulmonary infarction. No right heart strain.  Patient has severe and intractable pleuritic pain.  Heparin was started and admitted to the hospital.   Assessment & Plan:   Principal Problem:   Pulmonary embolus with infarction [facility]) Active Problems:   COVID-19 virus infection  Acute submassive pulmonary embolism secondary to recent COVID-19 viral infection with severe pleuritic chest pain: Patient was started on heparin infusion, will continue today.  If remains a stable and not using any oxygen, will look forward to change to oral anticoagulant tomorrow. 2D echocardiogram was ordered, will review results and it would likely not change any management. No other aggravating factors for thromboembolism. Chest physiotherapy and mobility today, Patient has severe pleuritic chest pain due to right lower lobe infarction, use adequate pain medications to help with chest physiotherapy and lung expansion. Agree with monitoring in the hospital given severe symptoms.  SpO2: 98 % O2 Flow Rate (L/min): 2 L/min   Recent COVID-19 infection: Patient with improved initial symptoms of viral infection.  Symptomatic management.   DVT prophylaxis: Heparin infusion   Code Status: Full code Family Communication: None, mother could not pick up the phone  Disposition Plan: Status is: Inpatient  Remains inpatient  appropriate because:Inpatient level of care appropriate due to severity of illness   Dispo: The patient is from: Home              Anticipated d/c is to: Home              Anticipated d/c date is: 2 days              Patient currently is not medically stable to d/c.         Consultants:   None  Procedures:   None  Antimicrobials:   None   Subjective: Patient seen and examined.  Overnight she had ongoing severe pain not relieved with 2 mg of morphine.  Complained of right flank pain 10 out of 10 on my examination.   Objective: Vitals:   02/02/20 0256 02/02/20 0449 02/02/20 0615 02/02/20 1251  BP: 129/87 124/80 125/74 (!) 147/89  Pulse: 95 (!) 105 (!) 108 88  Resp: (!) 24 (!) 26 (!) 28 19  Temp: 99.4 F (37.4 C)     TempSrc: Oral     SpO2: 97% 98% 98% 98%  Weight:      Height:       No intake or output data in the 24 hours ending 02/02/20 1330 Filed Weights   02/01/20 1513  Weight: 104.4 kg    Examination:  General exam: Appears in moderate distress mostly due to pain. Respiratory system: Clear to auscultation. Respiratory effort normal.  No added sounds. Cardiovascular system: S1 & S2 heard, RRR. No JVD, murmurs, rubs, gallops or clicks. No pedal edema. Gastrointestinal system: Abdomen is nondistended, soft and nontender. No organomegaly or masses felt. Normal bowel sounds heard. Central nervous system: Alert and oriented.  No focal neurological deficits. Extremities: Symmetric 5 x 5 power. Skin: No rashes, lesions or ulcers Psychiatry: Judgement and insight appear normal. Mood & affect anxious.    Data Reviewed: I have personally reviewed following labs and imaging studies  CBC: Recent Labs  Lab 02/01/20 1740 02/02/20 0310  WBC 8.2 10.4  NEUTROABS 3.7  --   HGB 12.7 11.2*  HCT 38.9 35.4*  MCV 86.8 87.6  PLT 426* 354   Basic Metabolic Panel: Recent Labs  Lab 02/01/20 1740 02/02/20 0310  NA 140 138  K 3.8 4.4  CL 105 105  CO2 21* 24   GLUCOSE 114* 114*  BUN 11 7  CREATININE 0.93 0.78  CALCIUM 9.8 9.4   GFR: Estimated Creatinine Clearance: 128.5 mL/min (by C-G formula based on SCr of 0.78 mg/dL). Liver Function Tests: No results for input(s): AST, ALT, ALKPHOS, BILITOT, PROT, ALBUMIN in the last 168 hours. No results for input(s): LIPASE, AMYLASE in the last 168 hours. No results for input(s): AMMONIA in the last 168 hours. Coagulation Profile: Recent Labs  Lab 02/01/20 1740 02/01/20 2026  INR 1.0 1.0   Cardiac Enzymes: No results for input(s): CKTOTAL, CKMB, CKMBINDEX, TROPONINI in the last 168 hours. BNP (last 3 results) No results for input(s): PROBNP in the last 8760 hours. HbA1C: No results for input(s): HGBA1C in the last 72 hours. CBG: No results for input(s): GLUCAP in the last 168 hours. Lipid Profile: No results for input(s): CHOL, HDL, LDLCALC, TRIG, CHOLHDL, LDLDIRECT in the last 72 hours. Thyroid Function Tests: No results for input(s): TSH, T4TOTAL, FREET4, T3FREE, THYROIDAB in the last 72 hours. Anemia Panel: No results for input(s): VITAMINB12, FOLATE, FERRITIN, TIBC, IRON, RETICCTPCT in the last 72 hours. Sepsis Labs: No results for input(s): PROCALCITON, LATICACIDVEN in the last 168 hours.  Recent Results (from the past 240 hour(s))  SARS Coronavirus 2 by RT PCR (hospital order, performed in [facility] hospital lab) Nasopharyngeal Nasopharyngeal Swab     Status: Abnormal   Collection Time: 02/01/20  4:25 PM   Specimen: Nasopharyngeal Swab  Result Value Ref Range Status   SARS Coronavirus 2 POSITIVE (A) NEGATIVE Final    Comment: RESULT CALLED TO, READ BACK BY AND VERIFIED WITH: RHONEY, A RN 1931 02/01/20 JM (NOTE) SARS-CoV-2 target nucleic acids are DETECTED  SARS-CoV-2 RNA is generally detectable in upper respiratory specimens  during the acute phase of infection.  Positive results are indicative  of the presence of the identified virus, but do not rule out bacterial infection or  co-infection with other pathogens not detected by the test.  Clinical correlation with patient history and  other diagnostic information is necessary to determine patient infection status.  The expected result is negative.  Fact Sheet for Patients:   BoilerBrush.com.cy   Fact Sheet for Healthcare Providers:   https://pope.com/    This test is not yet approved or cleared by the [place] FDA and  has been authorized for detection and/or diagnosis of SARS-CoV-2 by FDA under an Emergency Use Authorization (EUA).  This EUA will remain in effect (meaning this test can  be used) for the duration of  the COVID-19 declaration under Section 564(b)(1) of the Act, 21 U.S.C. section 360-bbb-3(b)(1), unless the authorization is terminated or revoked sooner.  Performed at [facility], [address], [place], [place] [address]          Radiology Studies: DG Chest 2 View  Result Date: 02/01/2020 CLINICAL DATA:  Right upper quadrant abdominal  pain. Right upper back pain. Shortness of breath since yesterday. COVID-19 positive test more than 2 weeks ago. EXAM: CHEST - 2 VIEW COMPARISON:  None. FINDINGS: Normal sized heart. Clear lungs. Mild peribronchial thickening. Mild scoliosis. IMPRESSION: Mild bronchitic changes. Electronically Signed   By: Beckie Salts M.D.   On: 02/01/2020 15:39   CT Angio Chest PE W and/or Wo Contrast  Result Date: 02/01/2020 CLINICAL DATA:  Right upper abdominal pain, right upper back pain and shortness of breath. COVID-19 positive test more than 2 weeks ago. EXAM: CT ANGIOGRAPHY CHEST WITH CONTRAST TECHNIQUE: Multidetector CT imaging of the chest was performed using the standard protocol during bolus administration of intravenous contrast. Multiplanar CT image reconstructions and MIPs were obtained to evaluate the vascular anatomy. CONTRAST:  OMNIPAQUE IOHEXOL 350 MG/ML SOLN COMPARISON:  Chest  radiographs obtained earlier today. FINDINGS: Cardiovascular: Large pulmonary arterial filling defect beginning in the distal right main pulmonary artery and extending into the right lower lobe pulmonary arteries. The right ventricular to left ventricular ratio is 0.58. Normal sized heart and central pulmonary arteries. Mediastinum/Nodes: No enlarged mediastinal, hilar, or axillary lymph nodes. Thyroid gland, trachea, and esophagus demonstrate no significant findings. Lungs/Pleura: Wedge-shaped area of airspace opacity in the posterior aspect of the right lower lobe. Mild left lower lobe atelectasis. Areas of mild peripheral patchy interstitial opacity in both lungs. No pleural fluid. Upper Abdomen: 4 mm mid right renal calculus. Musculoskeletal: Normal appearing bones. Review of the MIP images confirms the above findings. IMPRESSION: 1. Large right-sided pulmonary emboli, as described above, without right heart strain. 2. Wedge-shaped area of airspace opacity in the posterior aspect of the right lower lobe, compatible with a pulmonary infarct. 3. Mild left lower lobe atelectasis. 4. Areas of mild peripheral patchy interstitial opacity in both lungs, compatible with changes due to COVID-19 pneumonitis. 5. 4 mm nonobstructing right renal calculus. Critical Value/emergent results were called by telephone at the time of interpretation on 02/01/2020 at 7:36 pm to provider [facility] , who verbally acknowledged these results. Electronically Signed   By: Beckie Salts M.D.   On: 02/01/2020 19:37        Scheduled Meds: . sodium chloride flush  3 mL Intravenous Q12H   Continuous Infusions: . heparin 1,450 Units/hr (02/02/20 0457)     LOS: 1 day    Time spent: 30 minutes    Dorcas Carrow, MD [facility] Pager [phone]

## 2020-02-03 LAB — CBC
HCT: 34.9 % — ABNORMAL LOW (ref 36.0–46.0)
Hemoglobin: 11.3 g/dL — ABNORMAL LOW (ref 12.0–15.0)
MCH: 28.3 pg (ref 26.0–34.0)
MCHC: 32.4 g/dL (ref 30.0–36.0)
MCV: 87.3 fL (ref 80.0–100.0)
Platelets: 368 10*3/uL (ref 150–400)
RBC: 4 MIL/uL (ref 3.87–5.11)
RDW: 15 % (ref 11.5–15.5)
WBC: 10.9 10*3/uL — ABNORMAL HIGH (ref 4.0–10.5)
nRBC: 0 % (ref 0.0–0.2)

## 2020-02-03 LAB — HEPARIN LEVEL (UNFRACTIONATED): Heparin Unfractionated: 0.27 IU/mL — ABNORMAL LOW (ref 0.30–0.70)

## 2020-02-03 MED ORDER — ENOXAPARIN SODIUM 100 MG/ML ~~LOC~~ SOLN
100.0000 mg | Freq: Two times a day (BID) | SUBCUTANEOUS | Status: DC
  Administered 2020-02-03 – 2020-02-04 (×3): 100 mg via SUBCUTANEOUS
  Filled 2020-02-03 (×4): qty 1

## 2020-02-03 MED ORDER — CYCLOBENZAPRINE HCL 5 MG PO TABS: 5.0000 mg | ORAL_TABLET | Freq: Three times a day (TID) | ORAL | Status: DC | PRN

## 2020-02-03 MED ORDER — LIDOCAINE 5 % EX PTCH
1.0000 | MEDICATED_PATCH | CUTANEOUS | Status: DC
  Administered 2020-02-03 – 2020-02-04 (×2): 1 via TRANSDERMAL
  Filled 2020-02-03 (×3): qty 1

## 2020-02-03 NOTE — Progress Notes (Addendum)
ANTICOAGULATION CONSULT NOTE  Pharmacy Consult for heparin > Lovenox 9/6 Indication: acute bilateral pulmonary embolus  No Known Allergies  Patient Measurements: Height: 5\' 3"  (160 cm) Weight: 104.4 kg (230 lb 3.2 oz) IBW/kg (Calculated) : 52.4 Heparin Dosing Weight: 77 kg  Vital Signs: BP: 91/69 (09/06 0600) Pulse Rate: 94 (09/06 0600)  Labs: Recent Labs     0000 02/01/20 1740 02/01/20 2026 02/02/20 0305 02/02/20 0310 02/02/20 1040 02/02/20 1202 02/02/20 1848 02/03/20 0300  HGB   < > 12.7  --   --  11.2*  --   --   --  11.3*  HCT  --  38.9  --   --  35.4*  --   --   --  34.9*  PLT  --  426*  --   --  354  --   --   --  368  APTT  --   --  23*  --   --   --   --   --   --   LABPROT  --  12.9 12.8  --   --   --   --   --   --   INR  --  1.0 1.0  --   --   --   --   --   --   HEPARINUNFRC  --   --   --    < >  --    < > 0.36 0.40 0.27*  CREATININE  --  0.93  --   --  0.78  --   --   --   --    < > = values in this interval not displayed.   Estimated Creatinine Clearance: 128.5 mL/min (by C-G formula based on SCr of 0.78 mg/dL).  Assessment: Patient's a 24 y.o F who tested COVID+ two weeks ago, presented to the ED on 9/4 with c/o SOB, abdominal and back pain.  Chest CTA on 9/4 showed bilateral PE with no right heart strain.  She's currently on heparin drip for acute PE.  9/5: heparin level obtained at 11a was supra-therapeutic at 1.24, but blood sample collected on with same arm as heparin infusion.  Repeat heparin level collected at noon came back therapeutic at 0.36  Today, 02/03/2020: 0300 Hep level 0.27, slightly under therapeutic range No problems with infusion, pump, s/s bleed per RN Hgb sl decr, Plt wnl  Goal of Therapy:  Heparin level 0.3-0.7 units/ml Monitor platelets by anticoagulation protocol: Yes   Plan:  - Increase heparin drip to 1650 units/hr - Recheck hep level today at 1430 - daily heparin level and CBC - monitor for s/sx bleeding  04/04/2020 PharmD 02/03/2020, 8:07 AM   Addendum 10:00 Transition to Lovenox, d/c Heparin infusion Lovenox 100mg  SQ q12, begin one hour after Heparin discontinued Monitor for s/s bleed  04/04/2020 PharmD 02/03/2020, 10:10 AM

## 2020-02-03 NOTE — Progress Notes (Signed)
PROGRESS NOTE    Tracey Montgomery  UVO:[id number] DOB: 1996-02-08 DOA: 02/01/2020 PCP: System, Pcp Not In    Brief Narrative:  24 year old female with no medical issues, diagnosed with COVID-19 about 2 weeks ago treated symptomatically at home with some improvement however since last 5 days continues to have shortness of breath which is matter fact worsening with severe right-sided chest pain and cough.  No fever. In the emergency room, hemodynamically stable.  Initially on room air.  Electrolytes are normal.  COVID-19 positive.  CTA showed large right-sided pulmonary emboli, right lower lobe pulmonary infarction. No right heart strain.  Patient has severe and intractable pleuritic pain.  Heparin was started and admitted to the hospital.   Assessment & Plan:   Principal Problem:   Pulmonary embolus with infarction [facility]) Active Problems:   COVID-19 virus infection  Acute submassive pulmonary embolism secondary to recent COVID-19 viral infection with severe pleuritic chest pain: Patient was started on heparin infusion, tolerating well.  Echocardiogram without right heart strain. Patient still with significant pleuritic pain and needing frequent IV opiates for pain relief. We will change to Lovenox 1 mg/kg twice a day for convenience and less monitoring required. Chest physiotherapy and mobility today, Patient has severe pleuritic chest pain due to right lower lobe infarction, use adequate pain medications to help with chest physiotherapy and lung expansion. Agree with monitoring in the hospital given severe symptoms. Will try muscle relaxants, local lidocaine patch to improve pain.  We discussed with patient regarding availability of different anticoagulation agents to treat the present condition.  After discussing different injectable and oral choices, side effect profiles, reversibility and convenience of use, patient made informed decision to use Eliquis.  Given submassive PE, she will  benefit with continuing DOAC for 6 months.  We discussed about side effects mainly bleeding, injury prevention, not becoming pregnant while on treatment and patient is well educated.   SpO2: 90 % O2 Flow Rate (L/min): 2 L/min   Recent COVID-19 infection: Patient with improved initial symptoms of viral infection.  Symptomatic management.   DVT prophylaxis: Heparin infusion, changed to Lovenox.   Code Status: Full code Family Communication: mother on phone Disposition Plan: Status is: Inpatient  Remains inpatient appropriate because:Inpatient level of care appropriate due to severity of illness   Dispo: The patient is from: Home              Anticipated d/c is to: Home              Anticipated d/c date is: 2 days              Patient currently is not medically stable to d/c.         Consultants:   None  Procedures:   None  Antimicrobials:   None   Subjective: Seen and examined.  Still in the ER.  Still has ongoing pain and unable to take any deep breaths because of severe pleuritic pain and spasms.  On 2 L of oxygen.  Has not mobilized yet because of the pain.  Objective: Vitals:   02/03/20 0600 02/03/20 0831 02/03/20 1113 02/03/20 1130  BP: 91/69 (!) 141/75 (!) 154/97   Pulse: 94 93 (!) 113 (!) 115  Resp: (!) 24 20 (!) 28 (!) 32  Temp:      TempSrc:      SpO2: 97% 98% 92% 90%  Weight:      Height:       No intake or output data  in the 24 hours ending 02/03/20 1207 Filed Weights   02/01/20 1513  Weight: 104.4 kg    Examination:  General exam: Appears in moderate distress mostly due to pain. Respiratory system: Clear to auscultation. Respiratory effort normal.  No added sounds. Cardiovascular system: S1 & S2 heard, RRR. No JVD, murmurs, rubs, gallops or clicks. No pedal edema. Gastrointestinal system: Abdomen is nondistended, soft and nontender. No organomegaly or masses felt. Normal bowel sounds heard. Central nervous system: Alert and oriented. No  focal neurological deficits. Extremities: Symmetric 5 x 5 power. Skin: No rashes, lesions or ulcers Psychiatry: Judgement and insight appear normal. Mood & affect anxious.    Data Reviewed: I have personally reviewed following labs and imaging studies  CBC: Recent Labs  Lab 02/01/20 1740 02/02/20 0310 02/03/20 0300  WBC 8.2 10.4 10.9*  NEUTROABS 3.7  --   --   HGB 12.7 11.2* 11.3*  HCT 38.9 35.4* 34.9*  MCV 86.8 87.6 87.3  PLT 426* 354 368   Basic Metabolic Panel: Recent Labs  Lab 02/01/20 1740 02/02/20 0310  NA 140 138  K 3.8 4.4  CL 105 105  CO2 21* 24  GLUCOSE 114* 114*  BUN 11 7  CREATININE 0.93 0.78  CALCIUM 9.8 9.4   GFR: Estimated Creatinine Clearance: 128.5 mL/min (by C-G formula based on SCr of 0.78 mg/dL). Liver Function Tests: No results for input(s): AST, ALT, ALKPHOS, BILITOT, PROT, ALBUMIN in the last 168 hours. No results for input(s): LIPASE, AMYLASE in the last 168 hours. No results for input(s): AMMONIA in the last 168 hours. Coagulation Profile: Recent Labs  Lab 02/01/20 1740 02/01/20 2026  INR 1.0 1.0   Cardiac Enzymes: No results for input(s): CKTOTAL, CKMB, CKMBINDEX, TROPONINI in the last 168 hours. BNP (last 3 results) No results for input(s): PROBNP in the last 8760 hours. HbA1C: No results for input(s): HGBA1C in the last 72 hours. CBG: No results for input(s): GLUCAP in the last 168 hours. Lipid Profile: No results for input(s): CHOL, HDL, LDLCALC, TRIG, CHOLHDL, LDLDIRECT in the last 72 hours. Thyroid Function Tests: No results for input(s): TSH, T4TOTAL, FREET4, T3FREE, THYROIDAB in the last 72 hours. Anemia Panel: No results for input(s): VITAMINB12, FOLATE, FERRITIN, TIBC, IRON, RETICCTPCT in the last 72 hours. Sepsis Labs: No results for input(s): PROCALCITON, LATICACIDVEN in the last 168 hours.  Recent Results (from the past 240 hour(s))  SARS Coronavirus 2 by RT PCR (hospital order, performed in [facility] hospital  lab) Nasopharyngeal Nasopharyngeal Swab     Status: Abnormal   Collection Time: 02/01/20  4:25 PM   Specimen: Nasopharyngeal Swab  Result Value Ref Range Status   SARS Coronavirus 2 POSITIVE (A) NEGATIVE Final    Comment: RESULT CALLED TO, READ BACK BY AND VERIFIED WITH: RHONEY, A RN 1931 02/01/20 JM (NOTE) SARS-CoV-2 target nucleic acids are DETECTED  SARS-CoV-2 RNA is generally detectable in upper respiratory specimens  during the acute phase of infection.  Positive results are indicative  of the presence of the identified virus, but do not rule out bacterial infection or co-infection with other pathogens not detected by the test.  Clinical correlation with patient history and  other diagnostic information is necessary to determine patient infection status.  The expected result is negative.  Fact Sheet for Patients:   BoilerBrush.com.cy   Fact Sheet for Healthcare Providers:   https://pope.com/    This test is not yet approved or cleared by the [place] FDA and  has been authorized  for detection and/or diagnosis of SARS-CoV-2 by FDA under an Emergency Use Authorization (EUA).  This EUA will remain in effect (meaning this test can  be used) for the duration of  the COVID-19 declaration under Section 564(b)(1) of the Act, 21 U.S.C. section 360-bbb-3(b)(1), unless the authorization is terminated or revoked sooner.  Performed at [facility], [address] [place], [place] [address]          Radiology Studies: DG Chest 2 View  Result Date: 02/01/2020 CLINICAL DATA:  Right upper quadrant abdominal pain. Right upper back pain. Shortness of breath since yesterday. COVID-19 positive test more than 2 weeks ago. EXAM: CHEST - 2 VIEW COMPARISON:  None. FINDINGS: Normal sized heart. Clear lungs. Mild peribronchial thickening. Mild scoliosis. IMPRESSION: Mild bronchitic changes. Electronically Signed   By: Beckie Salts M.D.   On: 02/01/2020 15:39   CT Angio Chest PE W and/or Wo Contrast  Result Date: 02/01/2020 CLINICAL DATA:  Right upper abdominal pain, right upper back pain and shortness of breath. COVID-19 positive test more than 2 weeks ago. EXAM: CT ANGIOGRAPHY CHEST WITH CONTRAST TECHNIQUE: Multidetector CT imaging of the chest was performed using the standard protocol during bolus administration of intravenous contrast. Multiplanar CT image reconstructions and MIPs were obtained to evaluate the vascular anatomy. CONTRAST:  OMNIPAQUE IOHEXOL 350 MG/ML SOLN COMPARISON:  Chest radiographs obtained earlier today. FINDINGS: Cardiovascular: Large pulmonary arterial filling defect beginning in the distal right main pulmonary artery and extending into the right lower lobe pulmonary arteries. The right ventricular to left ventricular ratio is 0.58. Normal sized heart and central pulmonary arteries. Mediastinum/Nodes: No enlarged mediastinal, hilar, or axillary lymph nodes. Thyroid gland, trachea, and esophagus demonstrate no significant findings. Lungs/Pleura: Wedge-shaped area of airspace opacity in the posterior aspect of the right lower lobe. Mild left lower lobe atelectasis. Areas of mild peripheral patchy interstitial opacity in both lungs. No pleural fluid. Upper Abdomen: 4 mm mid right renal calculus. Musculoskeletal: Normal appearing bones. Review of the MIP images confirms the above findings. IMPRESSION: 1. Large right-sided pulmonary emboli, as described above, without right heart strain. 2. Wedge-shaped area of airspace opacity in the posterior aspect of the right lower lobe, compatible with a pulmonary infarct. 3. Mild left lower lobe atelectasis. 4. Areas of mild peripheral patchy interstitial opacity in both lungs, compatible with changes due to COVID-19 pneumonitis. 5. 4 mm nonobstructing right renal calculus. Critical Value/emergent results were called by telephone at the time of interpretation on  02/01/2020 at 7:36 pm to provider [facility] , who verbally acknowledged these results. Electronically Signed   By: Beckie Salts M.D.   On: 02/01/2020 19:37   ECHOCARDIOGRAM LIMITED  Result Date: 02/02/2020    ECHOCARDIOGRAM LIMITED REPORT   Patient Name:   Tracey Montgomery Date of Exam: 02/02/2020 Medical Rec #:  [id number]      Height:       63.0 in Accession #:    [id number]     Weight:       230.2 lb Date of Birth:  Jan 17, 1996     BSA:          2.053 m Patient Age:    23 years       BP:           125/74 mmHg Patient Gender: F              HR:           100 bpm. Exam Location:  Inpatient Procedure: Limited Echo, Limited Color Doppler, Cardiac Doppler and Intracardiac            Opacification Agent Indications:    Pulmonary Embolus 415.19 / I26.99  History:        Patient has no prior history of Echocardiogram examinations.                 Risk Factors:Non-Smoker.  Sonographer:    Renella CunasJulia Swaim RDCS Referring Phys: [id number] VISHAL R PATEL  Sonographer Comments: Covid positive. IMPRESSIONS  1. Left ventricular ejection fraction, by estimation, is 50 to 55%. The left ventricle has low normal function. The left ventricle has no regional wall motion abnormalities. Left ventricular diastolic parameters were normal.  2. Right ventricular systolic function is normal. The right ventricular size is normal. Tricuspid regurgitation signal is inadequate for assessing PA pressure.  3. The mitral valve is normal in structure. No evidence of mitral valve regurgitation.  4. The aortic valve is tricuspid. Aortic valve regurgitation is not visualized. No aortic stenosis is present. FINDINGS  Left Ventricle: Left ventricular ejection fraction, by estimation, is 50 to 55%. The left ventricle has low normal function. The left ventricle has no regional wall motion abnormalities. Definity contrast agent was given IV to delineate the left ventricular endocardial borders. The left ventricular internal cavity size was normal in size. There  is no left ventricular hypertrophy. Right Ventricle: The right ventricular size is normal. Right vetricular wall thickness was not assessed. Right ventricular systolic function is normal. Tricuspid regurgitation signal is inadequate for assessing PA pressure. Pericardium: Trivial pericardial effusion is present. Mitral Valve: The mitral valve is normal in structure. Tricuspid Valve: The tricuspid valve is normal in structure. Tricuspid valve regurgitation is trivial. Aortic Valve: The aortic valve is tricuspid. Aortic valve regurgitation is not visualized. No aortic stenosis is present. Pulmonic Valve: The pulmonic valve was not well visualized. Pulmonic valve regurgitation is not visualized. Aorta: The aortic root is normal in size and structure. LEFT VENTRICLE PLAX 2D LVIDd:         4.20 cm      Diastology LVIDs:         3.10 cm      LV e' lateral:   10.10 cm/s LV PW:         0.90 cm      LV E/e' lateral: 6.6 LV IVS:        0.90 cm      LV e' medial:    8.05 cm/s LVOT diam:     2.10 cm      LV E/e' medial:  8.2 LV SV:         46 LV SV Index:   23 LVOT Area:     3.46 cm  LV Volumes (MOD) LV vol d, MOD A4C: 118.0 ml LV vol s, MOD A4C: 54.0 ml LV SV MOD A4C:     118.0 ml RIGHT VENTRICLE RV S prime:     10.80 cm/s TAPSE (M-mode): 2.0 cm LEFT ATRIUM         Index LA diam:    3.10 cm 1.51 cm/m  AORTIC VALVE LVOT Vmax:   77.70 cm/s LVOT Vmean:  55.500 cm/s LVOT VTI:    0.134 m  AORTA Ao Root diam: 2.70 cm MITRAL VALVE MV Area (PHT): 3.53 cm    SHUNTS MV Decel Time: 215 msec    Systemic VTI:  0.13 m MV E velocity: 66.40 cm/s  Systemic Diam: 2.10 cm MV A velocity:  47.10 cm/s MV E/A ratio:  1.41 Epifanio Lesches MD Electronically signed by Epifanio Lesches MD Signature Date/Time: 02/02/2020/2:28:57 PM    Final         Scheduled Meds: . enoxaparin (LOVENOX) injection  100 mg Subcutaneous BID  . lidocaine  1 patch Transdermal Q24H  . sodium chloride flush  3 mL Intravenous Q12H   Continuous  Infusions:    LOS: 2 days    Time spent: 30 minutes    Dorcas Carrow, MD [facility] Pager [phone]

## 2020-02-04 DIAGNOSIS — R042 Hemoptysis: Secondary | ICD-10-CM | POA: Diagnosis not present

## 2020-02-04 LAB — CBC
HCT: 31.7 % — ABNORMAL LOW (ref 36.0–46.0)
Hemoglobin: 10.3 g/dL — ABNORMAL LOW (ref 12.0–15.0)
MCH: 28.3 pg (ref 26.0–34.0)
MCHC: 32.5 g/dL (ref 30.0–36.0)
MCV: 87.1 fL (ref 80.0–100.0)
Platelets: 308 10*3/uL (ref 150–400)
RBC: 3.64 MIL/uL — ABNORMAL LOW (ref 3.87–5.11)
RDW: 14.9 % (ref 11.5–15.5)
WBC: 7.8 10*3/uL (ref 4.0–10.5)
nRBC: 0 % (ref 0.0–0.2)

## 2020-02-04 MED ORDER — APIXABAN 5 MG PO TABS: 5.0000 mg | ORAL_TABLET | Freq: Two times a day (BID) | ORAL | Status: DC

## 2020-02-04 MED ORDER — APIXABAN 5 MG PO TABS
10.0000 mg | ORAL_TABLET | Freq: Two times a day (BID) | ORAL | Status: DC
  Administered 2020-02-04 – 2020-02-05 (×2): 10 mg via ORAL
  Filled 2020-02-04: qty 2
  Filled 2020-02-04: qty 4
  Filled 2020-02-04: qty 2

## 2020-02-04 NOTE — Discharge Instructions (Signed)
Information on my medicine - ELIQUIS (apixaban)   Why was Eliquis prescribed for you? Eliquis was prescribed to treat blood clots that may have been found in the veins of your legs (deep vein thrombosis) or in your lungs (pulmonary embolism) and to reduce the risk of them occurring again.  What do You need to know about Eliquis ? The starting dose is 10 mg (two 5 mg tablets) taken TWICE daily for the FIRST SEVEN (7) DAYS, then on (enter date)  02/11/20  the dose is reduced to ONE 5 mg tablet taken TWICE daily.  Eliquis may be taken with or without food.   Try to take the dose about the same time in the morning and in the evening. If you have difficulty swallowing the tablet whole please discuss with your pharmacist how to take the medication safely.  Take Eliquis exactly as prescribed and DO NOT stop taking Eliquis without talking to the doctor who prescribed the medication.  Stopping may increase your risk of developing a new blood clot.  Refill your prescription before you run out.  After discharge, you should have regular check-up appointments with your healthcare provider that is prescribing your Eliquis.    What do you do if you miss a dose? If a dose of ELIQUIS is not taken at the scheduled time, take it as soon as possible on the same day and twice-daily administration should be resumed. The dose should not be doubled to make up for a missed dose.  Important Safety Information A possible side effect of Eliquis is bleeding. You should call your healthcare provider right away if you experience any of the following: ? Bleeding from an injury or your nose that does not stop. ? Unusual colored urine (red or dark brown) or unusual colored stools (red or black). ? Unusual bruising for unknown reasons. ? A serious fall or if you hit your head (even if there is no bleeding).  Some medicines may interact with Eliquis and might increase your risk of bleeding or clotting while on  Eliquis. To help avoid this, consult your healthcare provider or pharmacist prior to using any new prescription or non-prescription medications, including herbals, vitamins, non-steroidal anti-inflammatory drugs (NSAIDs) and supplements.  This website has more information on Eliquis (apixaban): http://www.eliquis.com/eliquis/home

## 2020-02-04 NOTE — Progress Notes (Signed)
ANTICOAGULATION CONSULT NOTE  Pharmacy Consult for apixaban Indication: acute bilateral pulmonary embolus  No Known Allergies  Patient Measurements: Height: 5\' 3"  (160 cm) Weight: 104.4 kg (230 lb 3.2 oz) IBW/kg (Calculated) : 52.4  Vital Signs: Temp: 98.1 F (36.7 C) (09/07 0938) Temp Source: Oral (09/07 0938) BP: 130/86 (09/07 0938) Pulse Rate: 90 (09/07 0938)  Labs: Recent Labs     0000 02/01/20 1740 02/01/20 2026 02/02/20 0305 02/02/20 0310 02/02/20 0310 02/02/20 1040 02/02/20 1202 02/02/20 1848 02/03/20 0300 02/04/20 0510  HGB   < > 12.7  --   --  11.2*   < >  --   --   --  11.3* 10.3*  HCT   < > 38.9  --   --  35.4*  --   --   --   --  34.9* 31.7*  PLT   < > 426*  --   --  354  --   --   --   --  368 308  APTT  --   --  23*  --   --   --   --   --   --   --   --   LABPROT  --  12.9 12.8  --   --   --   --   --   --   --   --   INR  --  1.0 1.0  --   --   --   --   --   --   --   --   HEPARINUNFRC  --   --   --    < >  --   --    < > 0.36 0.40 0.27*  --   CREATININE  --  0.93  --   --  0.78  --   --   --   --   --   --    < > = values in this interval not displayed.   Estimated Creatinine Clearance: 128.5 mL/min (by C-G formula based on SCr of 0.78 mg/dL).  Assessment: Patient's a 24 y.o F who tested COVID+ two weeks ago, presented to the ED on 9/4 with c/o SOB, abdominal and back pain.  Chest CTA on 9/4 showed bilateral PE with no right heart strain.    Anticoagulation: -9/4: Heparin drip initiated -9/6: Heparin transitioned to therapeutic LMWH -9/7: LMWH transitioned to apixaban  Today, 02/04/2020:  Pt currently prescribed enoxaparin 100 mg (1 mg/kg) subQ q12h. LD 9/7  CBC: Hgb slightly low but stable; Plt WNL  Pt having some streaky hemoptysis per MD note   Plan:   Discontinued enoxaparin  Start apixaban 10 mg PO BID x7 days followed by apixaban 5 mg PO BID  Initiate apixaban 12 hours after last enoxaparin dose  Monitor for signs of  bleeding  11/7, PharmD 02/04/20 2:21 PM

## 2020-02-04 NOTE — Progress Notes (Signed)
PROGRESS NOTE    Tracey Montgomery  XIP:[id number] DOB: 04-01-1996 DOA: 02/01/2020 PCP: System, Pcp Not In    Brief Narrative:  24 year old female with no medical issues, diagnosed with COVID-19 about 2 weeks ago treated symptomatically at home with some improvement however since last 5 days continues to have shortness of breath which is matter fact worsening with severe right-sided chest pain and cough.  No fever. In the emergency room, hemodynamically stable.  Initially on room air.  Electrolytes are normal.  COVID-19 positive.  CTA showed large right-sided pulmonary emboli, right lower lobe pulmonary infarction. No right heart strain.  Patient had severe and intractable pleuritic pain.  Heparin was started and admitted to the hospital.   Assessment & Plan:   Principal Problem:   Pulmonary embolus with infarction [facility]) Active Problems:   COVID-19 virus infection   Cough with hemoptysis  Acute submassive pulmonary embolism secondary to recent COVID-19 viral infection with severe pleuritic chest pain: Initially treated with heparin, tolerated well and changed to Lovenox.   Stop Lovenox, start on Eliquis.   Had significant pleuritic chest pain that is gradually improving today.   We will try to manage her pain on oral pain medications, continue deep breathing exercises and incentive spirometry as well as mobility.    We discussed with patient regarding availability of different anticoagulation agents to treat the present condition.  After discussing different injectable and oral choices, side effect profiles, reversibility and convenience of use, patient made informed decision to use Eliquis.  Given submassive PE, she will benefit with continuing DOAC for 6 months.  We discussed about side effects mainly bleeding, injury prevention, not becoming pregnant while on treatment and patient is well educated.  Streaky hemoptysis: Patient had multiple episodes of streaky hemoptysis last night with  mucoid sputum production.  This is anticipated from pulmonary infarction.  Hemoglobin dropped by 2 points last 3 days.  Spontaneously resolving. Will monitor on continuous anticoagulation.   SpO2: 97 % O2 Flow Rate (L/min): 2 L/min, weaning to room air.   Recent COVID-19 infection: Patient with improved initial symptoms of viral infection.  Symptomatic management. Patient is agreeable to take COVID-19 vaccination after improvement of his symptoms.   DVT prophylaxis: Lovenox.  Start Eliquis.   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              Patient currently is not medically stable to d/c.  Patient had 3 episodes of streaky hemoptysis, will monitor tonight in the hospital.       Consultants:   None  Procedures:   None  Antimicrobials:   None   Subjective: Patient seen and examined.  Moved to inpatient bed.  Right-sided pleuritic pain is getting better and oxygen requirements getting better.  Able to walk around better today.  Low-grade temperature overnight. Significant events, patient had a large ball of mucus with bloody sputum last night that made her feel better.  She had subsequent 2 episodes of cough with streaky blood on the sputum. None since morning.  Objective: Vitals:   02/04/20 0630 02/04/20 0700 02/04/20 0912 02/04/20 6734  BP: 126/75 140/76 129/82 130/86  Pulse: (!) 103 (!) 103 93 90  Resp: 13 14 16 20   Temp:   99.1 F (37.3 C) 98.1 F (36.7 C)  TempSrc:    Oral  SpO2: 94% 96% 99% 97%  Weight:      Height:        Intake/Output Summary (Last 24 hours) at 02/04/2020 1348 Last data filed at 02/04/2020 0951 Gross per 24 hour  Intake 0 ml  Output  0 ml  Net 0 ml   Filed Weights   02/01/20 1513  Weight: 104.4 kg    Examination:  General exam: Looks comfortable.  Mostly on room air. Respiratory system: Clear to auscultation. Respiratory effort normal.  No added sounds. Cardiovascular system: S1 & S2 heard, RRR. No JVD, murmurs, rubs, gallops or clicks. No pedal edema. Gastrointestinal system: Abdomen is nondistended, soft and nontender. No organomegaly or masses felt. Normal bowel sounds heard.  Obese and pendulous. Central nervous system: Alert and oriented. No focal neurological deficits. Extremities: Symmetric 5 x 5 power. Skin: No rashes, lesions or ulcers Psychiatry: Judgement and insight appear normal. Mood & affect anxious.    Data Reviewed: I have personally reviewed following labs and imaging studies  CBC: Recent Labs  Lab 02/01/20 1740 02/02/20 0310 02/03/20 0300 02/04/20 0510  WBC 8.2 10.4 10.9* 7.8  NEUTROABS 3.7  --   --   --   HGB 12.7 11.2* 11.3* 10.3*  HCT 38.9 35.4* 34.9* 31.7*  MCV 86.8 87.6 87.3 87.1  PLT 426* 354 368 308   Basic Metabolic Panel: Recent Labs  Lab 02/01/20 1740 02/02/20 0310  NA 140 138  K 3.8 4.4  CL 105 105  CO2 21* 24  GLUCOSE 114* 114*  BUN 11 7  CREATININE 0.93 0.78  CALCIUM 9.8 9.4   GFR: Estimated Creatinine Clearance: 128.5 mL/min (by C-G formula based on SCr of 0.78 mg/dL). Liver Function Tests: No results for input(s): AST, ALT, ALKPHOS, BILITOT, PROT, ALBUMIN in the last 168 hours. No results for input(s): LIPASE, AMYLASE in the last 168 hours. No results for input(s): AMMONIA in the last 168 hours. Coagulation Profile: Recent Labs  Lab 02/01/20 1740 02/01/20 2026  INR 1.0 1.0   Cardiac Enzymes: No results for input(s): CKTOTAL, CKMB, CKMBINDEX, TROPONINI in the last 168 hours. BNP (last 3 results) No results for input(s): PROBNP in the last 8760 hours. HbA1C: No results for input(s): HGBA1C in the last 72 hours. CBG: No results for input(s):  GLUCAP in the last 168 hours. Lipid Profile: No results for input(s): CHOL, HDL, LDLCALC, TRIG, CHOLHDL, LDLDIRECT in the last 72 hours. Thyroid Function Tests: No results for input(s): TSH, T4TOTAL, FREET4, T3FREE, THYROIDAB in the last 72 hours. Anemia Panel: No results for input(s): VITAMINB12, FOLATE, FERRITIN, TIBC, IRON, RETICCTPCT in the last 72 hours. Sepsis Labs: No results for input(s): PROCALCITON, LATICACIDVEN in the last 168 hours.  Recent Results (from the past 240 hour(s))  SARS Coronavirus 2 by RT PCR (hospital order, performed in [facility] hospital lab) Nasopharyngeal Nasopharyngeal Swab     Status: Abnormal   Collection Time: 02/01/20  4:25 PM   Specimen: Nasopharyngeal Swab  Result Value Ref Range Status   SARS Coronavirus 2 POSITIVE (A) NEGATIVE Final    Comment: RESULT CALLED TO, READ BACK BY AND VERIFIED WITH: RHONEY, A RN 1931 02/01/20 JM (NOTE) SARS-CoV-2 target nucleic acids are DETECTED  SARS-CoV-2 RNA is generally detectable in upper respiratory specimens  during the acute  phase of infection.  Positive results are indicative  of the presence of the identified virus, but do not rule out bacterial infection or co-infection with other pathogens not detected by the test.  Clinical correlation with patient history and  other diagnostic information is necessary to determine patient infection status.  The expected result is negative.  Fact Sheet for Patients:   BoilerBrush.com.cy   Fact Sheet for Healthcare Providers:   https://pope.com/    This test is not yet approved or cleared by the [place] FDA and  has been authorized for detection and/or diagnosis of SARS-CoV-2 by FDA under an Emergency Use Authorization (EUA).  This EUA will remain in effect (meaning this test can  be used) for the duration of  the COVID-19 declaration under Section 564(b)(1) of the Act, 21 U.S.C. section 360-bbb-3(b)(1), unless  the authorization is terminated or revoked sooner.  Performed at [facility], [address], [place], [place] [address]          Radiology Studies: No results found.      Scheduled Meds: . apixaban  10 mg Oral BID   Followed by  . [START ON 02/11/2020] apixaban  5 mg Oral BID  . lidocaine  1 patch Transdermal Q24H  . sodium chloride flush  3 mL Intravenous Q12H   Continuous Infusions:    LOS: 3 days    Time spent: 30 minutes    Dorcas Carrow, MD [facility] Pager [phone]

## 2020-02-04 NOTE — ED Notes (Signed)
PT called out stated she coughed up blood. PT coughed up moderate size bright red blood mixed with mucus. On call MD Rahore. Paged. PT states she feels relief, no change is chest pain, or SHOB. PT vitals remain stable and no other complaints at this time.

## 2020-02-05 LAB — CBC
HCT: 33.8 % — ABNORMAL LOW (ref 36.0–46.0)
Hemoglobin: 10.8 g/dL — ABNORMAL LOW (ref 12.0–15.0)
MCH: 27.8 pg (ref 26.0–34.0)
MCHC: 32 g/dL (ref 30.0–36.0)
MCV: 87.1 fL (ref 80.0–100.0)
Platelets: 299 10*3/uL (ref 150–400)
RBC: 3.88 MIL/uL (ref 3.87–5.11)
RDW: 14.6 % (ref 11.5–15.5)
WBC: 6 10*3/uL (ref 4.0–10.5)
nRBC: 0 % (ref 0.0–0.2)

## 2020-02-05 MED ORDER — APIXABAN 5 MG PO TABS: 10.0000 mg | ORAL_TABLET | Freq: Two times a day (BID) | ORAL | 0 refills | Status: AC

## 2020-02-05 MED ORDER — APIXABAN 5 MG PO TABS: 5.0000 mg | ORAL_TABLET | Freq: Two times a day (BID) | ORAL | 0 refills | Status: AC

## 2020-02-05 NOTE — TOC Transition Note (Signed)
Transition of Care [facility]) - CM/SW Discharge Note   Patient Details  Name: Tracey Montgomery MRN: [id number] Date of Birth: January 05, 1996  Transition of [facility]) CM/SW Contact:  Ida Rogue, LCSW Phone Number: 02/05/2020, 9:35 AM   Clinical Narrative:  MD stated that this patient is in need of PCP as none is listed, and it is imperative she follow up with someone due to Eliquis script.  Spoke to patient who clarified she does have a PCP, Dr Pecola Leisure here in [place].  Hospital follow up appointment obtained.  Patient states she has a ride home today and transportation in general. TOC sign off.     Final next level of care: Home/Self Care Barriers to Discharge: No Barriers Identified   Patient Goals and CMS Choice        Discharge Placement                       Discharge Plan and Services                                     Social Determinants of Health (SDOH) Interventions     Readmission Risk Interventions No flowsheet data found.

## 2020-02-05 NOTE — Discharge Summary (Signed)
Physician Discharge Summary  Tracey Montgomery RUE:[id number] DOB: 1996/05/28 DOA: 02/01/2020  PCP: System, Pcp Not In  Admit date: 02/01/2020 Discharge date: 02/05/2020  Admitted From: Home Disposition: Home  Recommendations for Outpatient Follow-up:  1. Follow up with PCP in 1-2 weeks 2. Please obtain BMP/CBC in one week 3. Continue to take Eliquis without interruption.  Home Health: Not applicable Equipment/Devices: Not applicable  Discharge Condition: Stable CODE STATUS: Full code Diet recommendation: Regular diet  Discharge summary: 24 year old female with no medical issues, she has morbid obesity with BMI more than 40, she was diagnosed with COVID-19 about 2 weeks ago and treated at home symptomatically.  Since last 5 days prior to admission, she was having worsening shortness of breath and severe right-sided chest pain so came to the ER.  In the emergency room, hemodynamically stable.  Initially on room air.  Electrolytes were normal.  COVID-19 positive.  CTA showed large bilateral pulmonary embolism and right lower lobe pulmonary infarction with no right heart strain.  Due to significant symptoms of pleuritic pain and submassive PE she was admitted and treated with heparin infusion and other symptomatic treatment including pain medications and chest physiotherapy with subsequent improvement of symptoms. Patient had streaky hemoptysis while on treatment that has resolved now.  Hemoglobin has remained stable. Patient was subsequently converted to low molecular weight heparin and then to Eliquis loading dose for 7 days and to continue for 3 to 6 months.  Patient has done adequate recovery, no more hemoptysis per last 24 hours, pain is controlled and not needing to use any pain medications.  Her PE was probably aggravated by COVID-19 infection, recommend reevaluation at 3 months to continue anticoagulation therapy.  Discharge Diagnoses:  Principal Problem:   Pulmonary embolus with  infarction [facility]) Active Problems:   COVID-19 virus infection   Cough with hemoptysis    Discharge Instructions  Discharge Instructions    Call MD for:  difficulty breathing, headache or visual disturbances   Complete by: As directed    Call MD for:  severe uncontrolled pain   Complete by: As directed    Diet general   Complete by: As directed    Discharge instructions   Complete by: As directed    Can use over the counter cough medicine and tylenol You are using Eliquis , a blood thinner , familiarize yourself about this medicine and side effect profiles. Will send information to you  Go back to work in 2 weeks   Increase activity slowly   Complete by: As directed      Allergies as of 02/05/2020   No Known Allergies     Medication List    TAKE these medications   apixaban 5 MG Tabs tablet Commonly known as: ELIQUIS Take 2 tablets (10 mg total) by mouth 2 (two) times daily for 6 days.   apixaban 5 MG Tabs tablet Commonly known as: ELIQUIS Take 1 tablet (5 mg total) by mouth 2 (two) times daily. Start taking on: February 12, 2020       Follow-up Information    Leilani Able, MD Follow up on 02/12/2020.   Specialty: Family Medicine Why: Wednesday at 3:30 for your hospital follow up appointment.  This will be virtual.  They will reach out to you the day before with a reminder.  Call to reschedule if this date and time do not work for Probation officer information: [address] [place] [address] [phone]  No Known Allergies  Consultations:  None   Procedures/Studies: DG Chest 2 View  Result Date: 02/01/2020 CLINICAL DATA:  Right upper quadrant abdominal pain. Right upper back pain. Shortness of breath since yesterday. COVID-19 positive test more than 2 weeks ago. EXAM: CHEST - 2 VIEW COMPARISON:  None. FINDINGS: Normal sized heart. Clear lungs. Mild peribronchial thickening. Mild scoliosis. IMPRESSION: Mild bronchitic changes.  Electronically Signed   By: Beckie SaltsSteven  Reid M.D.   On: 02/01/2020 15:39   CT Angio Chest PE W and/or Wo Contrast  Result Date: 02/01/2020 CLINICAL DATA:  Right upper abdominal pain, right upper back pain and shortness of breath. COVID-19 positive test more than 2 weeks ago. EXAM: CT ANGIOGRAPHY CHEST WITH CONTRAST TECHNIQUE: Multidetector CT imaging of the chest was performed using the standard protocol during bolus administration of intravenous contrast. Multiplanar CT image reconstructions and MIPs were obtained to evaluate the vascular anatomy. CONTRAST:  100mL OMNIPAQUE IOHEXOL 350 MG/ML SOLN COMPARISON:  Chest radiographs obtained earlier today. FINDINGS: Cardiovascular: Large pulmonary arterial filling defect beginning in the distal right main pulmonary artery and extending into the right lower lobe pulmonary arteries. The right ventricular to left ventricular ratio is 0.58. Normal sized heart and central pulmonary arteries. Mediastinum/Nodes: No enlarged mediastinal, hilar, or axillary lymph nodes. Thyroid gland, trachea, and esophagus demonstrate no significant findings. Lungs/Pleura: Wedge-shaped area of airspace opacity in the posterior aspect of the right lower lobe. Mild left lower lobe atelectasis. Areas of mild peripheral patchy interstitial opacity in both lungs. No pleural fluid. Upper Abdomen: 4 mm mid right renal calculus. Musculoskeletal: Normal appearing bones. Review of the MIP images confirms the above findings. IMPRESSION: 1. Large right-sided pulmonary emboli, as described above, without right heart strain. 2. Wedge-shaped area of airspace opacity in the posterior aspect of the right lower lobe, compatible with a pulmonary infarct. 3. Mild left lower lobe atelectasis. 4. Areas of mild peripheral patchy interstitial opacity in both lungs, compatible with changes due to COVID-19 pneumonitis. 5. 4 mm nonobstructing right renal calculus. Critical Value/emergent results were called by telephone at  the time of interpretation on 02/01/2020 at 7:36 pm to provider [facility] NA[facility]ATI , who verbally acknowledged these results. Electronically Signed   By: Beckie SaltsSteven  Reid M.D.   On: 02/01/2020 19:37   ECHOCARDIOGRAM LIMITED  Result Date: 02/02/2020    ECHOCARDIOGRAM LIMITED REPORT   Patient Name:   Julien NordmannDAJANE Schlatter Date of Exam: 02/02/2020 Medical Rec #:  [id number]      Height:       63.0 in Accession #:    [id number]     Weight:       230.2 lb Date of Birth:  04/02/1996     BSA:          2.053 m Patient Age:    23 years       BP:           125/74 mmHg Patient Gender: F              HR:           100 bpm. Exam Location:  Inpatient Procedure: Limited Echo, Limited Color Doppler, Cardiac Doppler and Intracardiac            Opacification Agent Indications:    Pulmonary Embolus 415.19 / I26.99  History:        Patient has no prior history of Echocardiogram examinations.                 Risk Factors:Non-Smoker.  Sonographer:    Renella Cunas RDCS Referring Phys: [id number] VISHAL R PATEL  Sonographer Comments: Covid positive. IMPRESSIONS  1. Left ventricular ejection fraction, by estimation, is 50 to 55%. The left ventricle has low normal function. The left ventricle has no regional wall motion abnormalities. Left ventricular diastolic parameters were normal.  2. Right ventricular systolic function is normal. The right ventricular size is normal. Tricuspid regurgitation signal is inadequate for assessing PA pressure.  3. The mitral valve is normal in structure. No evidence of mitral valve regurgitation.  4. The aortic valve is tricuspid. Aortic valve regurgitation is not visualized. No aortic stenosis is present. FINDINGS  Left Ventricle: Left ventricular ejection fraction, by estimation, is 50 to 55%. The left ventricle has low normal function. The left ventricle has no regional wall motion abnormalities. Definity contrast agent was given IV to delineate the left ventricular endocardial borders. The left ventricular internal cavity  size was normal in size. There is no left ventricular hypertrophy. Right Ventricle: The right ventricular size is normal. Right vetricular wall thickness was not assessed. Right ventricular systolic function is normal. Tricuspid regurgitation signal is inadequate for assessing PA pressure. Pericardium: Trivial pericardial effusion is present. Mitral Valve: The mitral valve is normal in structure. Tricuspid Valve: The tricuspid valve is normal in structure. Tricuspid valve regurgitation is trivial. Aortic Valve: The aortic valve is tricuspid. Aortic valve regurgitation is not visualized. No aortic stenosis is present. Pulmonic Valve: The pulmonic valve was not well visualized. Pulmonic valve regurgitation is not visualized. Aorta: The aortic root is normal in size and structure. LEFT VENTRICLE PLAX 2D LVIDd:         4.20 cm      Diastology LVIDs:         3.10 cm      LV e' lateral:   10.10 cm/s LV PW:         0.90 cm      LV E/e' lateral: 6.6 LV IVS:        0.90 cm      LV e' medial:    8.05 cm/s LVOT diam:     2.10 cm      LV E/e' medial:  8.2 LV SV:         46 LV SV Index:   23 LVOT Area:     3.46 cm  LV Volumes (MOD) LV vol d, MOD A4C: 118.0 ml LV vol s, MOD A4C: 54.0 ml LV SV MOD A4C:     118.0 ml RIGHT VENTRICLE RV S prime:     10.80 cm/s TAPSE (M-mode): 2.0 cm LEFT ATRIUM         Index LA diam:    3.10 cm 1.51 cm/m  AORTIC VALVE LVOT Vmax:   77.70 cm/s LVOT Vmean:  55.500 cm/s LVOT VTI:    0.134 m  AORTA Ao Root diam: 2.70 cm MITRAL VALVE MV Area (PHT): 3.53 cm    SHUNTS MV Decel Time: 215 msec    Systemic VTI:  0.13 m MV E velocity: 66.40 cm/s  Systemic Diam: 2.10 cm MV A velocity: 47.10 cm/s MV E/A ratio:  1.41 Epifanio Lesches MD Electronically signed by Epifanio Lesches MD Signature Date/Time: 02/02/2020/2:28:57 PM    Final    (Echo, Carotid, EGD, Colonoscopy, ERCP)    Subjective: Patient seen and examined.  Uneventful last 24 hours. Ready to go home.   Discharge Exam: Vitals:   02/05/20  0125 02/05/20 0510  BP: (!) 158/108 (!) 147/95  Pulse: Marland Kitchen)  105 (!) 101  Resp: 19 19  Temp: 98 F (36.7 C) 98.1 F (36.7 C)  SpO2: 94% 92%   Vitals:   02/04/20 1807 02/04/20 2126 02/05/20 0125 02/05/20 0510  BP: (!) 153/96 (!) 156/109 (!) 158/108 (!) 147/95  Pulse: 98 (!) 110 (!) 105 (!) 101  Resp: 20 19 19 19   Temp: 97.8 F (36.6 C) 98.1 F (36.7 C) 98 F (36.7 C) 98.1 F (36.7 C)  TempSrc: Oral   Oral  SpO2: 94% 95% 94% 92%  Weight:      Height:        General: Pt is alert, awake, not in acute distress Cardiovascular: RRR, S1/S2 +, no rubs, no gallops Respiratory: CTA bilaterally, no wheezing, no rhonchi Abdominal: Soft, NT, ND, bowel sounds + Extremities: no edema, no cyanosis    The results of significant diagnostics from this hospitalization (including imaging, microbiology, ancillary and laboratory) are listed below for reference.     Microbiology: Recent Results (from the past 240 hour(s))  SARS Coronavirus 2 by RT PCR (hospital order, performed in [facility] hospital lab) Nasopharyngeal Nasopharyngeal Swab     Status: Abnormal   Collection Time: 02/01/20  4:25 PM   Specimen: Nasopharyngeal Swab  Result Value Ref Range Status   SARS Coronavirus 2 POSITIVE (A) NEGATIVE Final    Comment: RESULT CALLED TO, READ BACK BY AND VERIFIED WITH: RHONEY, A RN 1931 02/01/20 JM (NOTE) SARS-CoV-2 target nucleic acids are DETECTED  SARS-CoV-2 RNA is generally detectable in upper respiratory specimens  during the acute phase of infection.  Positive results are indicative  of the presence of the identified virus, but do not rule out bacterial infection or co-infection with other pathogens not detected by the test.  Clinical correlation with patient history and  other diagnostic information is necessary to determine patient infection status.  The expected result is negative.  Fact Sheet for Patients:   04/02/20   Fact Sheet for Healthcare  Providers:   BoilerBrush.com.cy    This test is not yet approved or cleared by the https://pope.com/ FDA and  has been authorized for detection and/or diagnosis of SARS-CoV-2 by FDA under an Emergency Use Authorization (EUA).  This EUA will remain in effect (meaning this test can  be used) for the duration of  the COVID-19 declaration under Section 564(b)(1) of the Act, 21 U.S.C. section 360-bbb-3(b)(1), unless the authorization is terminated or revoked sooner.  Performed at [facility], [address], [place], [place]      Labs: BNP (last 3 results) No results for input(s): BNP in the last 8760 hours. Basic Metabolic Panel: Recent Labs  Lab 02/01/20 1740 02/02/20 0310  NA 140 138  K 3.8 4.4  CL 105 105  CO2 21* 24  GLUCOSE 114* 114*  BUN 11 7  CREATININE 0.93 0.78  CALCIUM 9.8 9.4   Liver Function Tests: No results for input(s): AST, ALT, ALKPHOS, BILITOT, PROT, ALBUMIN in the last 168 hours. No results for input(s): LIPASE, AMYLASE in the last 168 hours. No results for input(s): AMMONIA in the last 168 hours. CBC: Recent Labs  Lab 02/01/20 1740 02/02/20 0310 02/03/20 0300 02/04/20 0510 02/05/20 0358  WBC 8.2 10.4 10.9* 7.8 6.0  NEUTROABS 3.7  --   --   --   --   HGB 12.7 11.2* 11.3* 10.3* 10.8*  HCT 38.9 35.4* 34.9* 31.7* 33.8*  MCV 86.8 87.6 87.3 87.1 87.1  PLT 426* 354 368 308 299  Cardiac Enzymes: No results for input(s): CKTOTAL, CKMB, CKMBINDEX, TROPONINI in the last 168 hours. BNP: Invalid input(s): POCBNP CBG: No results for input(s): GLUCAP in the last 168 hours. D-Dimer No results for input(s): DDIMER in the last 72 hours. Hgb A1c No results for input(s): HGBA1C in the last 72 hours. Lipid Profile No results for input(s): CHOL, HDL, LDLCALC, TRIG, CHOLHDL, LDLDIRECT in the last 72 hours. Thyroid function studies No results for input(s): TSH, T4TOTAL, T3FREE, THYROIDAB in the last 72  hours.  Invalid input(s): FREET3 Anemia work up No results for input(s): VITAMINB12, FOLATE, FERRITIN, TIBC, IRON, RETICCTPCT in the last 72 hours. Urinalysis    Component Value Date/Time   COLORURINE YELLOW 02/01/2020 1816   APPEARANCEUR CLEAR 02/01/2020 1816   LABSPEC 1.026 02/01/2020 1816   PHURINE 5.0 02/01/2020 1816   GLUCOSEU NEGATIVE 02/01/2020 1816   HGBUR NEGATIVE 02/01/2020 1816   BILIRUBINUR NEGATIVE 02/01/2020 1816   KETONESUR NEGATIVE 02/01/2020 1816   PROTEINUR 100 (A) 02/01/2020 1816   NITRITE NEGATIVE 02/01/2020 1816   LEUKOCYTESUR TRACE (A) 02/01/2020 1816   Sepsis Labs Invalid input(s): PROCALCITONIN,  WBC,  LACTICIDVEN Microbiology Recent Results (from the past 240 hour(s))  SARS Coronavirus 2 by RT PCR (hospital order, performed in [facility] hospital lab) Nasopharyngeal Nasopharyngeal Swab     Status: Abnormal   Collection Time: 02/01/20  4:25 PM   Specimen: Nasopharyngeal Swab  Result Value Ref Range Status   SARS Coronavirus 2 POSITIVE (A) NEGATIVE Final    Comment: RESULT CALLED TO, READ BACK BY AND VERIFIED WITH: RHONEY, A RN 1931 02/01/20 JM (NOTE) SARS-CoV-2 target nucleic acids are DETECTED  SARS-CoV-2 RNA is generally detectable in upper respiratory specimens  during the acute phase of infection.  Positive results are indicative  of the presence of the identified virus, but do not rule out bacterial infection or co-infection with other pathogens not detected by the test.  Clinical correlation with patient history and  other diagnostic information is necessary to determine patient infection status.  The expected result is negative.  Fact Sheet for Patients:   BoilerBrush.com.cy   Fact Sheet for Healthcare Providers:   https://pope.com/    This test is not yet approved or cleared by the [place] FDA and  has been authorized for detection and/or diagnosis of SARS-CoV-2 by FDA under an  Emergency Use Authorization (EUA).  This EUA will remain in effect (meaning this test can  be used) for the duration of  the COVID-19 declaration under Section 564(b)(1) of the Act, 21 U.S.C. section 360-bbb-3(b)(1), unless the authorization is terminated or revoked sooner.  Performed at [facility], [address] [place], [place] [address]      Time coordinating discharge: 35 minutes  SIGNED:   Dorcas Carrow, MD  [facility] 02/05/2020, 1:38 PM

## 2020-02-05 NOTE — Progress Notes (Signed)
ANTICOAGULATION CONSULT NOTE  Pharmacy Consult for apixaban Indication: acute bilateral pulmonary embolus  No Known Allergies  Patient Measurements: Height: 5\' 3"  (160 cm) Weight: 104.4 kg (230 lb 3.2 oz) IBW/kg (Calculated) : 52.4  Vital Signs: Temp: 98.1 F (36.7 C) (09/08 0510) Temp Source: Oral (09/08 0510) BP: 147/95 (09/08 0510) Pulse Rate: 101 (09/08 0510)  Labs: Recent Labs    02/02/20 1202 02/02/20 1848 02/03/20 0300 02/03/20 0300 02/04/20 0510 02/05/20 0358  HGB  --   --  11.3*   < > 10.3* 10.8*  HCT  --   --  34.9*  --  31.7* 33.8*  PLT  --   --  368  --  308 299  HEPARINUNFRC 0.36 0.40 0.27*  --   --   --    < > = values in this interval not displayed.   Estimated Creatinine Clearance: 128.5 mL/min (by C-G formula based on SCr of 0.78 mg/dL).  Assessment: Patient's a 24 y.o F who tested COVID+ two weeks ago, presented to the ED on 9/4 with c/o SOB, abdominal and back pain.  Chest CTA on 9/4 showed bilateral PE with no right heart strain.    Anticoagulation: -9/4: Heparin drip initiated -9/6: Heparin transitioned to therapeutic LMWH -9/7: LMWH transitioned to apixaban  Today, 02/05/2020:  CBC: Hgb slightly low but stable; Plt WNL  Pt having some streaky hemoptysis per MD note, pt reports it has resolved today   Plan:   Continue apixaban 10 mg PO BID x7 days followed by apixaban 5 mg PO BID  Monitor for signs of bleeding  Medication counseling provided via telephone, manufacturer coupon provided  04/06/2020, PharmD 02/05/20 10:01 AM

## 2020-02-05 NOTE — Progress Notes (Signed)
Patient discharged to private vehicle by nursing staff. Discharge instructions reviewed.

## 2022-06-28 IMAGING — CR DG CHEST 2V
2 series · 2 of 2 positions shown · non-contrast
Comparison: None.

CLINICAL DATA: Right upper quadrant abdominal pain. Right upper
back pain. Shortness of breath since yesterday. [id number] positive
test more than 2 weeks ago.

EXAM:
CHEST - 2 VIEW

[w chest pa]
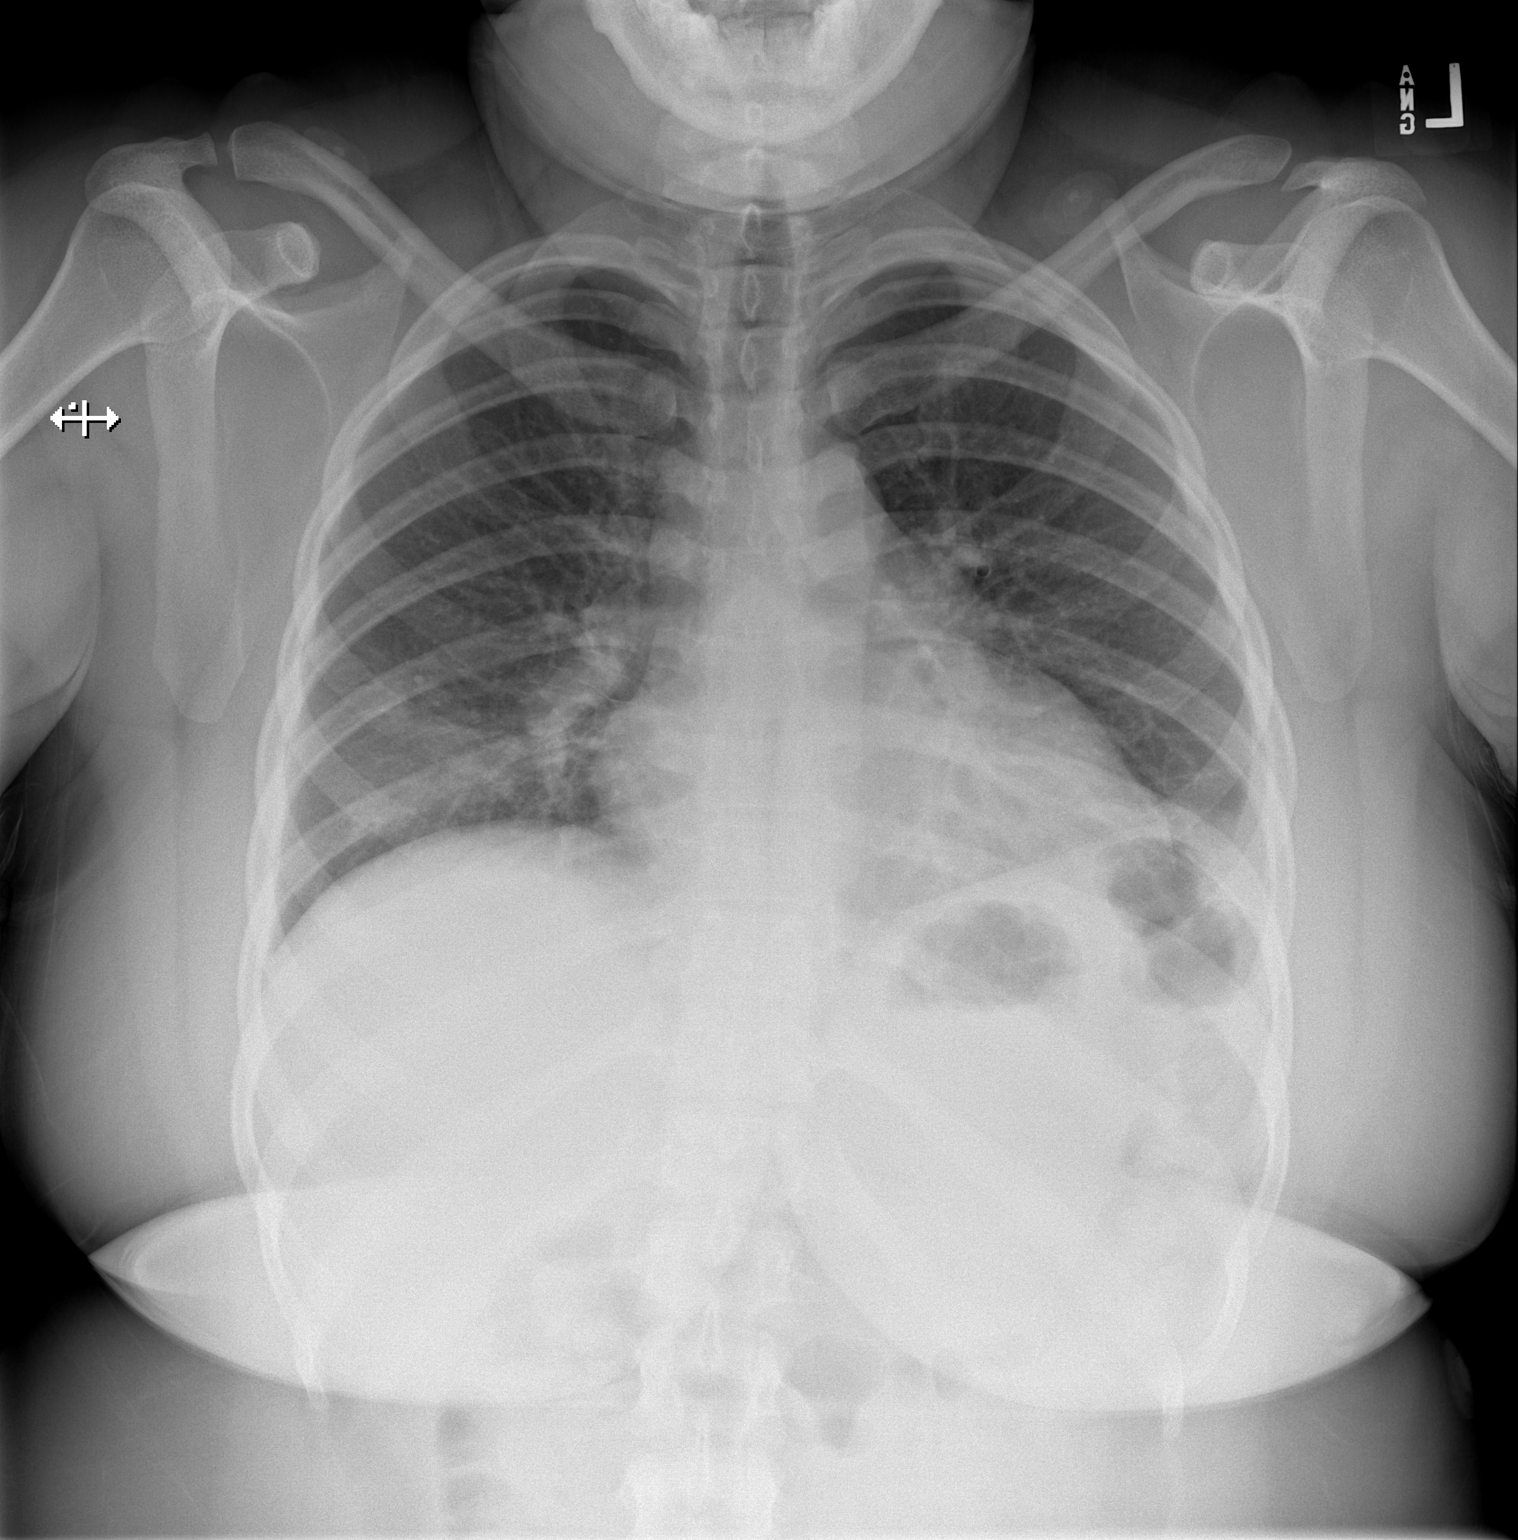

[w chest lat]
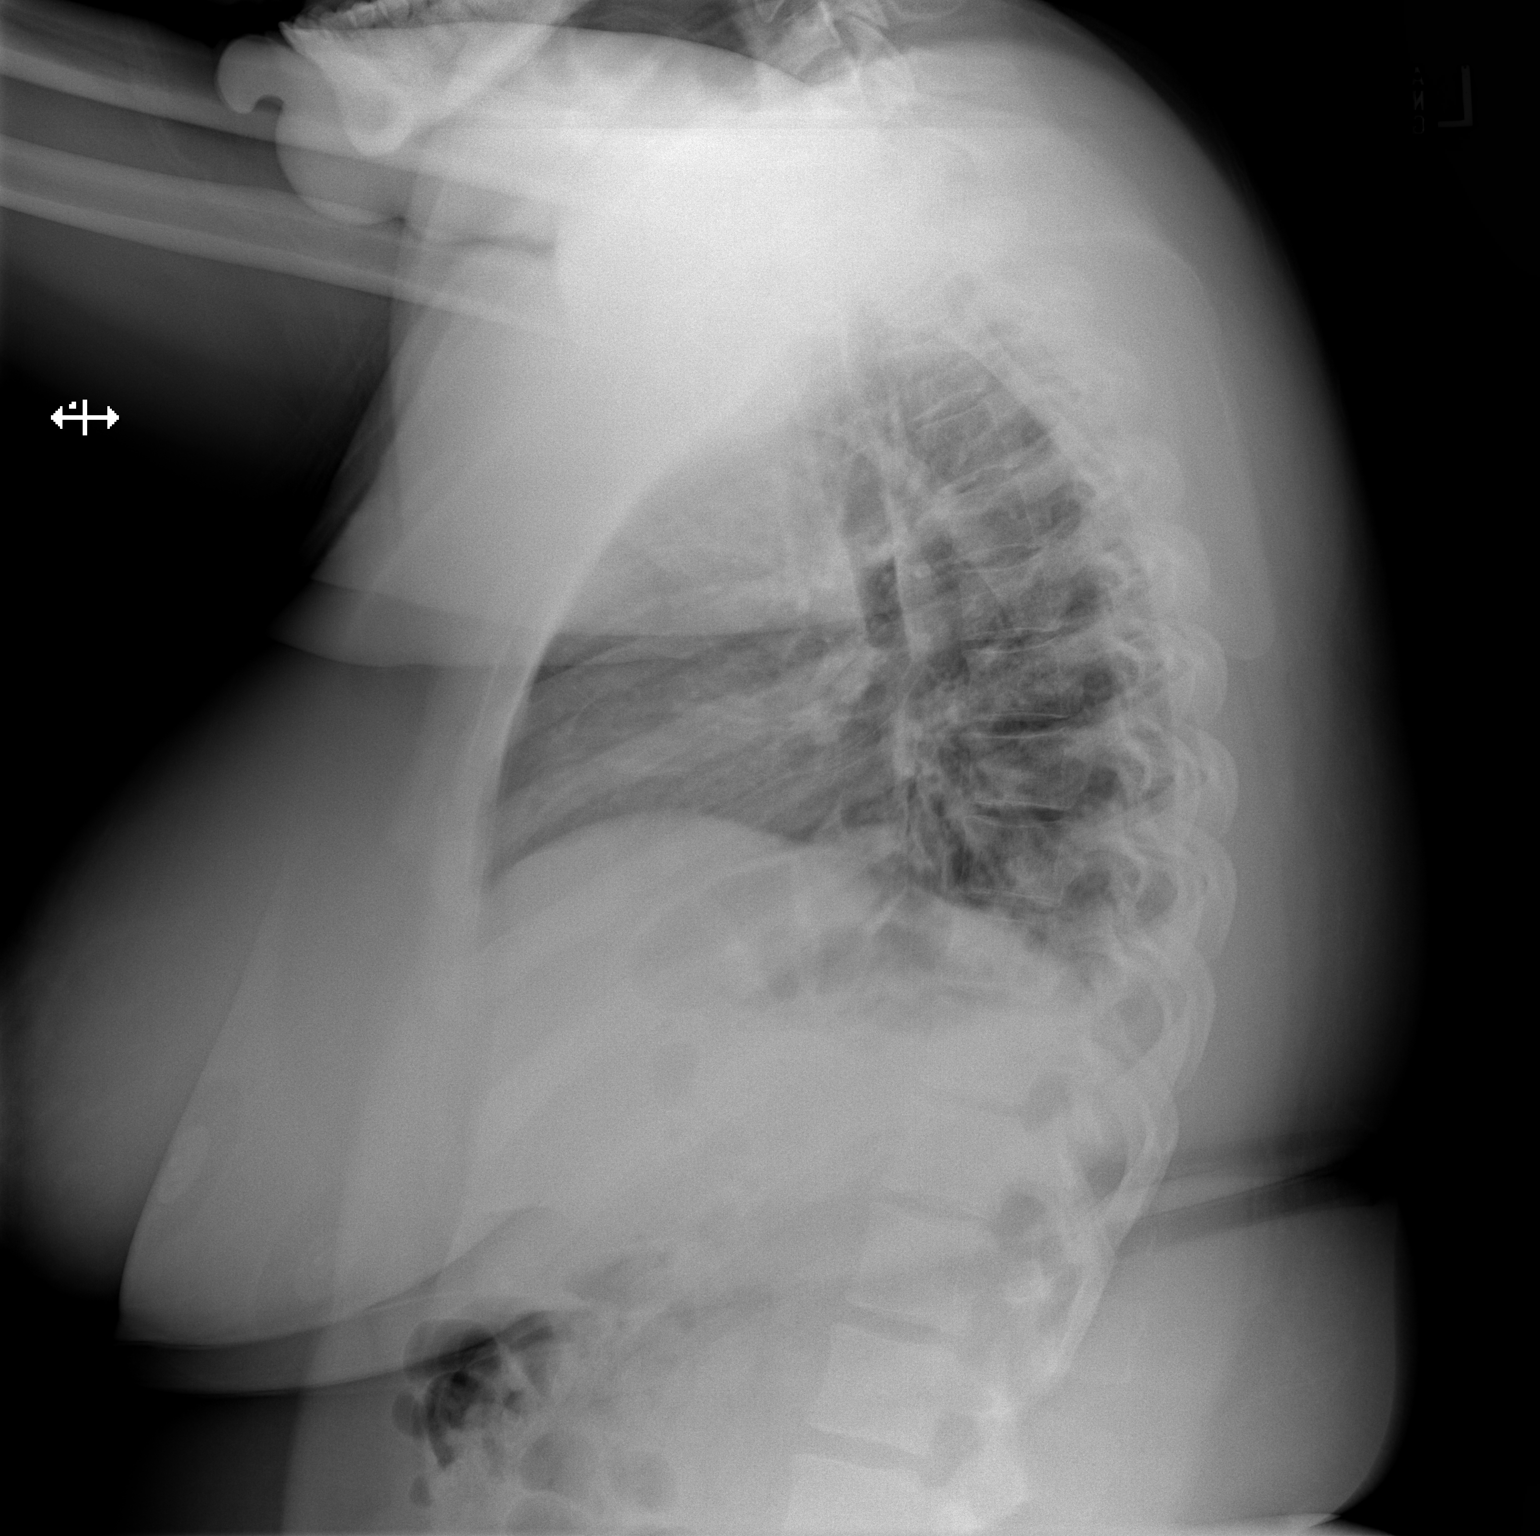

[2 of 2 positions shown; findings below may reference images not displayed]

FINDINGS: Normal sized heart. Clear lungs. Mild peribronchial thickening. Mild
scoliosis.
IMPRESSION: Mild bronchitic changes.

## 2023-11-13 NOTE — Progress Notes (Signed)
 " Subjective Patient ID: Tracey Montgomery is a 28 y.o. female.  Chief Complaint  Patient presents with   Hand Injury    Cut L middle finger on industrial door at work <30 mins. Endorse pain and bleeding at the site that will not stop with applied pressure.     The following information was reviewed by members of the visit team:  Tobacco  Meds  Med Hx  Surg Hx  OB Status  Fam Hx  Soc Hx     HPI  Presents with a laceration to her left middle finger.  She cut it on the industrial door at work about an hour ago.  Patient does not recall her last tetanus.  The laceration did go through her nail.  Review of Systems  Constitutional: Negative.   HENT: Negative.    Eyes:  Negative for visual disturbance.  Respiratory: Negative.    Cardiovascular: Negative.   Gastrointestinal: Negative.   Genitourinary:  Negative for dysuria.  Musculoskeletal: Negative.   Skin:  Positive for wound.  Neurological:  Negative for light-headedness and headaches.  Psychiatric/Behavioral: Negative.      Objective Physical Exam Constitutional:      Appearance: Normal appearance.  HENT:     Head: Normocephalic and atraumatic.     Nose: Nose normal.     Mouth/Throat:     Mouth: Mucous membranes are moist.   Eyes:     Conjunctiva/sclera: Conjunctivae normal.    Cardiovascular:     Rate and Rhythm: Normal rate and regular rhythm.  Pulmonary:     Effort: Pulmonary effort is normal.     Breath sounds: Normal breath sounds.   Musculoskeletal:        General: Normal range of motion.   Skin:    General: Skin is warm and dry.     Comments: Patient has a laceration to her left middle finger.  This did cause some skin avulsion around the middle finger.  When I lifted the nail, it had actually entirely avulsed from the nailbed.    Neurological:     General: No focal deficit present.     Mental Status: She is alert and oriented to person, place, and time.   Psychiatric:        Mood and  Affect: Mood normal.    Procedure note:  Digital anesthesia with 1% lidocaine .  Approximately 3 cc.  Patient tolerated well.  Cleanse wound with Hibiclens.  I used forceps and scissors to remove the nail that was partially avulsed from the matrix.  The partially avulsed skin at the border of the nail was also trimmed.  Wound was dressed with Xeroform and stockinette.  AlumaFoam splint was applied  Assessment/Plan Diagnoses and all orders for this visit:  Trauma -     XR Fingers Minimum 2 Views Left  Avulsion of fingernail, initial encounter  Other orders -     cetirizine (ZyrTEC) 10 mg tablet; Take 10 mg by mouth daily. -     TDAP VACCINE (BOOSTRIX,ADACEL) 7Y+  Differential diagnosis: Open fracture, superficial laceration, nailbed laceration, nail avulsion  Patient is a 28 year old female who presented with an injury to her left middle finger.  She had a laceration that extended from the lateral aspect of the finger across the nail.  The nail was almost entirely evulsed.  I gave her a digital block and then removed the distal section of the nail.  This involved about 50% of the nail.  The nailbed matrix itself was  intact and did not need suture repair.  The laceration to the side of the nail was superficial.  The epidermis was avulsed.  The finger was thoroughly cleansed.  I wrapped the finger in Xeroform and then we applied a stockinette dressing and a splint.  Plain films showed no evidence of fracture.  Patient was educated about wound care.  She was given dressing supplies.  We did update her tetanus.  Urgent Care Disposition:  Follow up with PCP   Electronically signed: Lorane Esau Bob, PA-C 11/13/2023  12:51 PM   "

## 2024-05-31 ENCOUNTER — Emergency Department (HOSPITAL_BASED_OUTPATIENT_CLINIC_OR_DEPARTMENT_OTHER)

## 2024-05-31 ENCOUNTER — Other Ambulatory Visit: Payer: Self-pay

## 2024-05-31 ENCOUNTER — Emergency Department (HOSPITAL_BASED_OUTPATIENT_CLINIC_OR_DEPARTMENT_OTHER)
Admission: EM | Admit: 2024-05-31 | Discharge: 2024-05-31 | Disposition: A | Discharge status: Home / Self Care | Attending: Emergency Medicine | Admitting: Emergency Medicine

## 2024-05-31 ENCOUNTER — Encounter (HOSPITAL_BASED_OUTPATIENT_CLINIC_OR_DEPARTMENT_OTHER): Payer: Self-pay | Admitting: Emergency Medicine

## 2024-05-31 DIAGNOSIS — R059 Cough, unspecified: Secondary | ICD-10-CM | POA: Diagnosis present

## 2024-05-31 DIAGNOSIS — J111 Influenza due to unidentified influenza virus with other respiratory manifestations: Secondary | ICD-10-CM | POA: Diagnosis not present

## 2024-05-31 DIAGNOSIS — Z7901 Long term (current) use of anticoagulants: Secondary | ICD-10-CM | POA: Diagnosis not present

## 2024-05-31 LAB — COMPREHENSIVE METABOLIC PANEL WITH GFR
ALT: 9 U/L (ref 0–44)
AST: 11 U/L — ABNORMAL LOW (ref 15–41)
Albumin: 4.6 g/dL (ref 3.5–5.0)
Alkaline Phosphatase: 77 U/L (ref 38–126)
Anion gap: 13 (ref 5–15)
BUN: 8 mg/dL (ref 6–20)
CO2: 22 mmol/L (ref 22–32)
Calcium: 9.9 mg/dL (ref 8.9–10.3)
Chloride: 103 mmol/L (ref 98–111)
Creatinine, Ser: 0.78 mg/dL (ref 0.44–1.00)
GFR, Estimated: >60 mL/min
Glucose, Bld: 191 mg/dL — ABNORMAL HIGH (ref 70–99)
Potassium: 4.2 mmol/L (ref 3.5–5.1)
Sodium: 137 mmol/L (ref 135–145)
Total Bilirubin: 0.5 mg/dL (ref 0.0–1.2)
Total Protein: 8.1 g/dL (ref 6.5–8.1)

## 2024-05-31 LAB — CBC WITH DIFFERENTIAL/PLATELET
Abs Immature Granulocytes: 0.01 K/uL (ref 0.00–0.07)
Basophils Absolute: 0 K/uL (ref 0.0–0.1)
Basophils Relative: 0 %
Eosinophils Absolute: 0.2 K/uL (ref 0.0–0.5)
Eosinophils Relative: 3 %
HCT: 38 % (ref 36.0–46.0)
Hemoglobin: 12.3 g/dL (ref 12.0–15.0)
Immature Granulocytes: 0 %
Lymphocytes Relative: 34 %
Lymphs Abs: 2.5 K/uL (ref 0.7–4.0)
MCH: 25.6 pg — ABNORMAL LOW (ref 26.0–34.0)
MCHC: 32.4 g/dL (ref 30.0–36.0)
MCV: 79.2 fL — ABNORMAL LOW (ref 80.0–100.0)
Monocytes Absolute: 0.6 K/uL (ref 0.1–1.0)
Monocytes Relative: 8 %
Neutro Abs: 4 K/uL (ref 1.7–7.7)
Neutrophils Relative %: 55 %
Platelets: 286 K/uL (ref 150–400)
RBC: 4.8 MIL/uL (ref 3.87–5.11)
RDW: 15.4 % (ref 11.5–15.5)
WBC: 7.3 K/uL (ref 4.0–10.5)
nRBC: 0 % (ref 0.0–0.2)

## 2024-05-31 LAB — TROPONIN T, HIGH SENSITIVITY: Troponin T High Sensitivity: <15 ng/L (ref 0–19)

## 2024-05-31 LAB — CBG MONITORING, ED: Glucose-Capillary: 233 mg/dL — ABNORMAL HIGH (ref 70–99)

## 2024-05-31 LAB — GROUP A STREP BY PCR: Group A Strep by PCR: NOT DETECTED

## 2024-05-31 MED ORDER — NAPROXEN 500 MG PO TABS: 500.0000 mg | ORAL_TABLET | Freq: Two times a day (BID) | ORAL | 0 refills | Status: AC

## 2024-05-31 MED ORDER — IOHEXOL 350 MG/ML SOLN
100.0000 mL | Freq: Once | INTRAVENOUS | Status: AC | PRN
  Administered 2024-05-31: 100 mL via INTRAVENOUS

## 2024-05-31 MED ORDER — FLUTICASONE PROPIONATE 50 MCG/ACT NA SUSP: 1.0000 | Freq: Every day | NASAL | 0 refills | Status: AC

## 2024-05-31 MED ORDER — GUAIFENESIN-CODEINE 100-10 MG/5ML PO SOLN: 5.0000 mL | Freq: Three times a day (TID) | ORAL | 0 refills | Status: AC | PRN

## 2024-05-31 NOTE — ED Notes (Signed)
Patient transported to imaging at this time.

## 2024-05-31 NOTE — ED Provider Notes (Signed)
 " [place] [facility] Provider Note   CSN: [id number] Arrival date & time: 05/31/24  1157     Patient presents with: Cough   Tracey Montgomery is a 29 y.o. female here for evaluation of cough and hemoptysis.  She has a prior history of provoked DVT in setting of COVID infection in 2021.  She traveled out of state to visit family for Christmas.  Other family numbers were sick however when she returned she assumed she had influenza-like illness.  She has had worsening pain to her bilateral lower calves as well as congestion, rhinorrhea, scratchy throat.  Now having pleuritic chest pain, cough.  Blood tinged hemoptysis.  Feels like symptoms are similar to when she had prior PE.   HPI     Prior to Admission medications  Medication Sig Start Date End Date Taking? Authorizing Provider  fluticasone (FLONASE) 50 MCG/ACT nasal spray Place 1 spray into both nostrils daily. 05/31/24  Yes Rickie Gutierres A, PA-C  guaiFENesin -codeine 100-10 MG/5ML syrup Take 5 mLs by mouth 3 (three) times daily as needed for cough. 05/31/24  Yes Dash Cardarelli A, PA-C  naproxen (NAPROSYN) 500 MG tablet Take 1 tablet (500 mg total) by mouth 2 (two) times daily. 05/31/24  Yes Adrine Hayworth A, PA-C  apixaban  (ELIQUIS ) 5 MG TABS tablet Take 2 tablets (10 mg total) by mouth 2 (two) times daily for 6 days. 02/05/20 02/11/20  Raenelle Coria, MD  apixaban  (ELIQUIS ) 5 MG TABS tablet Take 1 tablet (5 mg total) by mouth 2 (two) times daily. 02/12/20 03/13/20  Raenelle Coria, MD    Allergies: Patient has no known allergies.    Review of Systems  Constitutional:  Positive for activity change, appetite change, chills and fatigue. Negative for fever.  HENT:  Positive for congestion, postnasal drip, rhinorrhea, sinus pain and sore throat. Negative for trouble swallowing and voice change.   Respiratory:  Positive for cough and shortness of breath.   Cardiovascular: Negative.   Gastrointestinal:  Negative.   Genitourinary: Negative.   Musculoskeletal:  Positive for myalgias. Negative for arthralgias, back pain, neck pain and neck stiffness.       BIL lower leg pain  Skin: Negative.   Neurological: Negative.   All other systems reviewed and are negative.   Updated Vital Signs BP (!) 146/93 (BP Location: Right Arm)   Pulse 87   Temp 98.8 F (37.1 C) (Oral)   Resp 16   Wt 117.9 kg   LMP 05/14/2024 (Approximate)   SpO2 98%   BMI 46.06 kg/m   Physical Exam Vitals and nursing note reviewed.  Constitutional:      General: She is not in acute distress.    Appearance: She is well-developed. She is not ill-appearing, toxic-appearing or diaphoretic.  HENT:     Head: Normocephalic and atraumatic.     Nose: Congestion and rhinorrhea present.     Mouth/Throat:     Mouth: Mucous membranes are moist.     Pharynx: Posterior oropharyngeal erythema present.     Comments: PO erythema, uvula midline, no tonsillar exudate. Uvula midline Eyes:     Pupils: Pupils are equal, round, and reactive to light.  Cardiovascular:     Rate and Rhythm: Tachycardia present.     Pulses: Normal pulses.          Radial pulses are 2+ on the right side and 2+ on the left side.       Dorsalis pedis pulses are 2+ on  the right side and 2+ on the left side.     Heart sounds: Normal heart sounds.  Pulmonary:     Effort: Pulmonary effort is normal. No respiratory distress.     Breath sounds: Normal breath sounds and air entry.     Comments: Clear bilaterally, speaks in full sentences without difficulty Abdominal:     General: Bowel sounds are normal. There is no distension.     Palpations: Abdomen is soft.     Tenderness: There is no abdominal tenderness. There is no right CVA tenderness, left CVA tenderness or guarding.  Musculoskeletal:        General: No swelling, tenderness, deformity or signs of injury. Normal range of motion.     Cervical back: Normal range of motion.     Right lower leg: No edema.      Left lower leg: No edema.     Comments: No bony tenderness, compartments soft, full range of motion  Skin:    General: Skin is warm and dry.     Capillary Refill: Capillary refill takes less than 2 seconds.  Neurological:     General: No focal deficit present.     Mental Status: She is alert.     Cranial Nerves: No cranial nerve deficit.     Sensory: No sensory deficit.     Motor: No weakness.     Gait: Gait normal.  Psychiatric:        Mood and Affect: Mood normal.     (all labs ordered are listed, but only abnormal results are displayed) Labs Reviewed  CBC WITH DIFFERENTIAL/PLATELET - Abnormal; Notable for the following components:      Result Value   MCV 79.2 (*)    MCH 25.6 (*)    All other components within normal limits  COMPREHENSIVE METABOLIC PANEL WITH GFR - Abnormal; Notable for the following components:   Glucose, Bld 191 (*)    AST 11 (*)    All other components within normal limits  CBG MONITORING, ED - Abnormal; Notable for the following components:   Glucose-Capillary 233 (*)    All other components within normal limits  GROUP A STREP BY PCR  TROPONIN T, HIGH SENSITIVITY    EKG: None  Radiology: US  Venous Img Lower Bilateral Result Date: 05/31/2024 EXAM: ULTRASOUND DUPLEX OF THE BILATERAL LOWER EXTREMITY VEINS TECHNIQUE: Duplex ultrasound using B-mode/gray scaled imaging and Doppler spectral analysis and color flow was obtained of the deep venous structures of the bilateral lower extremity. COMPARISON: None available. CLINICAL HISTORY: Pain, hx of DVT, recent travel. FINDINGS: The common femoral vein, femoral vein, popliteal vein, and posterior tibial vein demonstrate normal compressibility with normal color flow and spectral analysis. IMPRESSION: 1. No bilateral lower extremity deep venous thrombosis. Electronically signed by: Morgane Naveau MD 05/31/2024 05:20 PM EST RP Workstation: [id number]   CT Angio Chest PE W and/or Wo Contrast Result Date:  05/31/2024 EXAM: CTA CHEST 05/31/2024 03:41:05 PM TECHNIQUE: CTA of the chest was performed without and with the administration of 100 mL of iohexol  (OMNIPAQUE ) 350 MG/ML injection. Multiplanar reformatted images are provided for review. MIP images are provided for review. Automated exposure control, iterative reconstruction, and/or weight based adjustment of the mA/kV was utilized to reduce the radiation dose to as low as reasonably achievable. Preliminary evaluation of the subsegmental level is limited due to motion artifact. COMPARISON: CT angio chest 02/01/2020, chest x-ray 02/01/2020 CLINICAL HISTORY: Pulmonary embolism (PE) suspected, high prob; hx pe, hemoptysis, leg pain. Cough ,  chills , body aches x 5 days , Reports back pain , left lower that started today . , Hx PE . Bilateral leg pain FINDINGS: PULMONARY ARTERIES: Pulmonary arteries are adequately opacified for evaluation. No acute pulmonary embolus. Main pulmonary artery is normal in caliber. MEDIASTINUM: The heart and pericardium demonstrate no acute abnormality. There is no acute abnormality of the thoracic aorta. LYMPH NODES: No mediastinal, hilar or axillary lymphadenopathy. LUNGS AND PLEURA: Right basilar atelectasis versus scarring. No focal consolidation or pulmonary edema. No evidence of pleural effusion or pneumothorax. UPPER ABDOMEN: Limited images of the upper abdomen demonstrate splenules. SOFT TISSUES AND BONES: No acute bone or soft tissue abnormality. IMPRESSION: 1. No pulmonary embolism identified, although evaluation is limited at the subsegmental level due to motion artifact. 2. Right basilar atelectasis. Electronically signed by: Morgane Naveau MD 05/31/2024 04:45 PM EST RP Workstation: [id number]   DG Chest 2 View Result Date: 05/31/2024 CLINICAL DATA:  Cough, fever EXAM: CHEST - 2 VIEW COMPARISON:  February 01, 2020 FINDINGS: The heart size and mediastinal contours are within normal limits. Both lungs are clear. The visualized  skeletal structures are unremarkable. IMPRESSION: No active cardiopulmonary disease. Electronically Signed   By: Lynwood Landy Raddle M.D.   On: 05/31/2024 13:23     Procedures   Medications Ordered in the ED  iohexol  (OMNIPAQUE ) 350 MG/ML injection 100 mL (100 mLs Intravenous Contrast Given 05/31/24 [id number])    29 year old here for evaluation of feeling unwell.  Patient with upper back pain, leg pain, URI symptoms.  Exposure to other sick contacts however history of prior PE thought likely provoked due to BMI/COVID.  Recently traveled out of state.  She is some tenderness to her bilateral calves however no unilateral edema, erythema.  Shared decision making for patient.  She likely has a viral illness however has risk factors for VTE.  Will plan on labs, imaging.  Labs and imaging personally viewed and interpreted:  CBC without leukocytosis, hemoglobin 12.3 Metabolic panel without significant abnormality, glucose 191 Trop <15 X-ray without significant abnormality EKG without ischemic changes US  venous BIL neg CTA neg for PE, infection  Patient reassessed.  Discussed labs and imaging.  Suspect likely influenza-like illness.  Wrote for symptomatic management.  Will have her follow-up outpatient, return for any worsening symptoms.  Tolerating p.o. intake here.  Ambulatory.  The patient has been appropriately medically screened and/or stabilized in the ED. I have low suspicion for any other emergent medical condition which would require further screening, evaluation or treatment in the ED or require inpatient management.  Patient is hemodynamically stable and in no acute distress.  Patient able to ambulate in department prior to ED.  Evaluation does not show acute pathology that would require ongoing or additional emergent interventions while in the emergency department or further inpatient treatment.  I have discussed the diagnosis with the patient and answered all questions.  Pain is been managed  while in the emergency department and patient has no further complaints prior to discharge.  Patient is comfortable with plan discussed in room and is stable for discharge at this time.  I have discussed strict return precautions for returning to the emergency department.  Patient was encouraged to follow-up with PCP/specialist refer to at discharge.                                       Medical Decision Making Amount and/or  Complexity of Data Reviewed Independent Historian: friend External Data Reviewed: labs, radiology, ECG and notes. Labs: ordered. Decision-making details documented in ED Course. Radiology: ordered and independent interpretation performed. Decision-making details documented in ED Course. ECG/medicine tests: ordered and independent interpretation performed. Decision-making details documented in ED Course.  Risk OTC drugs. Prescription drug management. Decision regarding hospitalization. Diagnosis or treatment significantly limited by social determinants of health.        Final diagnoses:  Influenza-like illness    ED Discharge Orders          Ordered    naproxen (NAPROSYN) 500 MG tablet  2 times daily        05/31/24 1730    fluticasone (FLONASE) 50 MCG/ACT nasal spray  Daily        05/31/24 1730    guaiFENesin -codeine 100-10 MG/5ML syrup  3 times daily PRN        05/31/24 1730               Rufino Staup A, PA-C 05/31/24 1732    Dreama Longs, MD 05/31/24 2259  "

## 2024-05-31 NOTE — ED Triage Notes (Addendum)
 Cough , chills , body aches x 5 days ,   Reports back pain , left lower that started today .  , Hx PE .  Bilateral leg pain

## 2024-05-31 NOTE — Discharge Instructions (Signed)
 It was a pleasure taking care of you here today.  You likely have influenza-like illness.  Your workup was negative for pneumonia, blood clots in your legs or your lungs, electrolyte abnormality  I have written you for a few medications to help  Follow-up outpatient, return for any worsening symptoms
